# Patient Record
Sex: Male | Born: 1980 | Race: Black or African American | Hispanic: No | Smoking: Current every day smoker
Health system: Southern US, Community
[De-identification: ages and names within clinical notes are randomized; demographics above are authoritative.]

---

## 2004-08-28 ENCOUNTER — Emergency Department: Payer: Self-pay | Admitting: Emergency Medicine

## 2004-12-10 ENCOUNTER — Emergency Department: Payer: Self-pay | Admitting: Emergency Medicine

## 2005-06-19 ENCOUNTER — Emergency Department: Payer: Self-pay | Admitting: Emergency Medicine

## 2005-10-21 ENCOUNTER — Emergency Department: Payer: Self-pay | Admitting: Emergency Medicine

## 2006-06-17 ENCOUNTER — Emergency Department: Payer: Self-pay | Admitting: Emergency Medicine

## 2007-05-03 ENCOUNTER — Emergency Department: Payer: Self-pay | Admitting: Emergency Medicine

## 2007-05-11 ENCOUNTER — Emergency Department: Payer: Self-pay | Admitting: Emergency Medicine

## 2007-10-26 ENCOUNTER — Emergency Department (HOSPITAL_COMMUNITY): Admission: EM | Admit: 2007-10-26 | Discharge: 2007-10-27 | Payer: Self-pay | Admitting: Emergency Medicine

## 2009-11-14 ENCOUNTER — Emergency Department: Payer: Self-pay | Admitting: Internal Medicine

## 2010-10-15 ENCOUNTER — Emergency Department: Payer: Self-pay | Admitting: Emergency Medicine

## 2011-02-16 LAB — DIFFERENTIAL
Eosinophils Absolute: 0
Eosinophils Relative: 0
Lymphocytes Relative: 14
Lymphs Abs: 1.1
Monocytes Relative: 13 — ABNORMAL HIGH

## 2011-02-16 LAB — URINALYSIS, ROUTINE W REFLEX MICROSCOPIC
Glucose, UA: NEGATIVE
Hgb urine dipstick: NEGATIVE
Ketones, ur: NEGATIVE
pH: 5.5

## 2011-02-16 LAB — COMPREHENSIVE METABOLIC PANEL
ALT: 14
AST: 21
Albumin: 3.7
CO2: 20
Calcium: 8.3 — ABNORMAL LOW
Creatinine, Ser: 1.14
GFR calc Af Amer: >60
GFR calc non Af Amer: >60
Sodium: 133 — ABNORMAL LOW
Total Protein: 6.5

## 2011-02-16 LAB — URINE MICROSCOPIC-ADD ON

## 2011-02-16 LAB — CBC
MCHC: 34.8
MCV: 86.4
Platelets: 234
RBC: 5.25
RDW: 13.4

## 2011-02-16 LAB — INFLUENZA A+B VIRUS AG-DIRECT(RAPID): Inflenza A Ag: NEGATIVE

## 2011-02-16 LAB — URINE CULTURE

## 2011-03-31 ENCOUNTER — Emergency Department: Payer: Self-pay | Admitting: Emergency Medicine

## 2014-02-06 ENCOUNTER — Emergency Department: Payer: Self-pay | Admitting: Emergency Medicine

## 2015-06-30 ENCOUNTER — Emergency Department
Admission: EM | Admit: 2015-06-30 | Discharge: 2015-06-30 | Disposition: A | Discharge status: Home / Self Care | Payer: Medicaid Other | Attending: Emergency Medicine | Admitting: Emergency Medicine

## 2015-06-30 ENCOUNTER — Encounter: Payer: Self-pay | Admitting: Emergency Medicine

## 2015-06-30 DIAGNOSIS — R112 Nausea with vomiting, unspecified: Secondary | ICD-10-CM | POA: Insufficient documentation

## 2015-06-30 DIAGNOSIS — R1012 Left upper quadrant pain: Secondary | ICD-10-CM | POA: Diagnosis not present

## 2015-06-30 DIAGNOSIS — F17210 Nicotine dependence, cigarettes, uncomplicated: Secondary | ICD-10-CM | POA: Diagnosis not present

## 2015-06-30 DIAGNOSIS — R197 Diarrhea, unspecified: Secondary | ICD-10-CM | POA: Insufficient documentation

## 2015-06-30 DIAGNOSIS — M549 Dorsalgia, unspecified: Secondary | ICD-10-CM | POA: Insufficient documentation

## 2015-06-30 DIAGNOSIS — R1013 Epigastric pain: Secondary | ICD-10-CM | POA: Diagnosis not present

## 2015-06-30 LAB — CBC
HEMATOCRIT: 50.3 % (ref 40.0–52.0)
Hemoglobin: 16.8 g/dL (ref 13.0–18.0)
MCH: 27.8 pg (ref 26.0–34.0)
MCHC: 33.3 g/dL (ref 32.0–36.0)
MCV: 83.5 fL (ref 80.0–100.0)
Platelets: 279 10*3/uL (ref 150–440)
RBC: 6.02 MIL/uL — ABNORMAL HIGH (ref 4.40–5.90)
RDW: 14.1 % (ref 11.5–14.5)
WBC: 8.6 10*3/uL (ref 3.8–10.6)

## 2015-06-30 LAB — COMPREHENSIVE METABOLIC PANEL
ALBUMIN: 4.5 g/dL (ref 3.5–5.0)
ALT: 19 U/L (ref 17–63)
AST: 30 U/L (ref 15–41)
Alkaline Phosphatase: 108 U/L (ref 38–126)
Anion gap: 10 (ref 5–15)
BILIRUBIN TOTAL: 0.9 mg/dL (ref 0.3–1.2)
BUN: 13 mg/dL (ref 6–20)
CHLORIDE: 108 mmol/L (ref 101–111)
CO2: 21 mmol/L — ABNORMAL LOW (ref 22–32)
Calcium: 9.3 mg/dL (ref 8.9–10.3)
Creatinine, Ser: 1.1 mg/dL (ref 0.61–1.24)
GFR calc Af Amer: >60 mL/min (ref >60)
GFR calc non Af Amer: >60 mL/min (ref >60)
GLUCOSE: 123 mg/dL — AB (ref 65–99)
POTASSIUM: 3.9 mmol/L (ref 3.5–5.1)
Sodium: 139 mmol/L (ref 135–145)
TOTAL PROTEIN: 7.5 g/dL (ref 6.5–8.1)

## 2015-06-30 LAB — URINALYSIS COMPLETE WITH MICROSCOPIC (ARMC ONLY)
Bilirubin Urine: NEGATIVE
GLUCOSE, UA: NEGATIVE mg/dL
Hgb urine dipstick: NEGATIVE
Ketones, ur: NEGATIVE mg/dL
Leukocytes, UA: NEGATIVE
Nitrite: NEGATIVE
Protein, ur: NEGATIVE mg/dL
RBC / HPF: NONE SEEN RBC/hpf (ref 0–5)
Specific Gravity, Urine: 1.029 (ref 1.005–1.030)
pH: 5 (ref 5.0–8.0)

## 2015-06-30 LAB — LIPASE, BLOOD: Lipase: 24 U/L (ref 11–51)

## 2015-06-30 MED ORDER — RANITIDINE HCL 150 MG PO TABS: 150.0000 mg | ORAL_TABLET | Freq: Two times a day (BID) | ORAL | Status: DC

## 2015-06-30 MED ORDER — SODIUM CHLORIDE 0.9 % IV BOLUS (SEPSIS)
1000.0000 mL | Freq: Once | INTRAVENOUS | Status: AC
  Administered 2015-06-30: 1000 mL via INTRAVENOUS

## 2015-06-30 MED ORDER — ONDANSETRON HCL 4 MG PO TABS: 4.0000 mg | ORAL_TABLET | Freq: Every day | ORAL | Status: DC | PRN

## 2015-06-30 MED ORDER — ONDANSETRON HCL 4 MG/2ML IJ SOLN
4.0000 mg | Freq: Once | INTRAMUSCULAR | Status: AC | PRN
  Administered 2015-06-30: 4 mg via INTRAVENOUS

## 2015-06-30 MED ORDER — ONDANSETRON HCL 4 MG/2ML IJ SOLN
INTRAMUSCULAR | Status: AC
  Administered 2015-06-30: 4 mg via INTRAVENOUS
  Filled 2015-06-30: qty 2

## 2015-06-30 NOTE — ED Provider Notes (Signed)
[facility] Emergency Department Provider Note  ____________________________________________  Time seen: Approximately 545 PM  I have reviewed the triage vital signs and the nursing notes.   HISTORY  Chief Complaint Emesis and Diarrhea    HPI Austin Herrera is a 35 y.o. male without any chronic medical conditions who is presenting today for nausea vomiting diarrhea and epigastric pain. He says that he has had 5-6 episodes of vomiting and diarrhea. No blood in his diarrhea or vomitus. Multiple sick contacts at home. No runny nose or cough. Says that he has not vomited since arriving to the hospital and tolerated a pair as well as some sips of water by mouth.   History reviewed. No pertinent past medical history.  There are no active problems to display for this patient.   History reviewed. No pertinent past surgical history.  No current outpatient prescriptions on file.  Allergies Review of patient's allergies indicates no known allergies.  No family history on file.  Social History Social History  Substance Use Topics  . Smoking status: Current Every Day Smoker -- 0.25 packs/day    Types: Cigarettes  . Smokeless tobacco: None  . Alcohol Use: Yes     Comment: socially    Review of Systems Constitutional: No fever/chills Eyes: No visual changes. ENT: No sore throat. Cardiovascular: Denies chest pain. Respiratory: Denies shortness of breath. Gastrointestinal:No constipation. Genitourinary: Negative for dysuria. Musculoskeletal: Diffuse aching to his back Skin: Negative for rash. Neurological: Negative for headaches, focal weakness or numbness.  10-point ROS otherwise negative.  ____________________________________________   PHYSICAL EXAM:  VITAL SIGNS: ED Triage Vitals  Enc Vitals Group     BP 06/30/15 1618 139/78 mmHg     Pulse Rate 06/30/15 1618 103     Resp 06/30/15 1618 18     Temp 06/30/15 1618 98.1 F (36.7 C)     Temp  Source 06/30/15 1618 Oral     SpO2 06/30/15 1618 99 %     Weight 06/30/15 1618 200 lb (90.719 kg)     Height 06/30/15 1618 6' (1.829 m)     Head Cir --      Peak Flow --      Pain Score 06/30/15 1619 5     Pain Loc --      Pain Edu? --      Excl. in GC? --     Constitutional: Alert and oriented. Well appearing and in no acute distress. Eyes: Conjunctivae are normal. PERRL. EOMI. Head: Atraumatic. Nose: No congestion/rhinnorhea. Mouth/Throat: Mucous membranes are moist.   Neck: No stridor.   Cardiovascular: Normal rate, regular rhythm. Heart rate was 83 for me in the room. rossly normal heart sounds.  Good peripheral circulation. Respiratory: Normal respiratory effort.  No retractions. Lungs CTAB. Gastrointestinal: Soft with minimal left upper quadrant as well as epigastric tenderness palpation. No distention.  No CVA tenderness. Musculoskeletal: No lower extremity tenderness nor edema.  No joint effusions. Neurologic:  Normal speech and language. No gross focal neurologic deficits are appreciated. No gait instability. Skin:  Skin is warm, dry and intact. No rash noted. Psychiatric: Mood and affect are normal. Speech and behavior are normal.  ____________________________________________   LABS (all labs ordered are listed, but only abnormal results are displayed)  Labs Reviewed  COMPREHENSIVE METABOLIC PANEL - Abnormal; Notable for the following:    CO2 21 (*)    Glucose, Bld 123 (*)    All other components within normal limits  CBC - Abnormal;  Notable for the following:    RBC 6.02 (*)    All other components within normal limits  URINALYSIS COMPLETEWITH MICROSCOPIC ([facility] ONLY) - Abnormal; Notable for the following:    Color, Urine YELLOW (*)    APPearance CLEAR (*)    Bacteria, UA RARE (*)    Squamous Epithelial / LPF 0-5 (*)    All other components within normal limits  LIPASE, BLOOD    ____________________________________________  EKG   ____________________________________________  RADIOLOGY   ____________________________________________   PROCEDURES   ____________________________________________   INITIAL IMPRESSION / ASSESSMENT AND PLAN / ED COURSE  Pertinent labs & imaging results that were available during my care of the patient were reviewed by me and considered in my medical decision making (see chart for details).  Patient with multiple sick contacts as well as a very reassuring lab workup. No lower abdominal tenderness or right lower quadrant tenderness palpation. I did give him return precautions for pain radiating down to his lower abdomen to the right lower quadrant. He understands the plan and is willing to comply. Will be discharged with Zofran as well as an antacid. Will follow-up with his primary care doctor.  ____________________________________________   FINAL CLINICAL IMPRESSION(S) / ED DIAGNOSES  Abdominal pain with nausea vomiting and diarrhea.    Myrna Blazer, MD 06/30/15 [phone]

## 2015-06-30 NOTE — ED Notes (Signed)
AAOx3.  Skin warm and dry.  NAD 

## 2015-06-30 NOTE — ED Notes (Signed)
Patient presents to the ED with nausea and vomiting that started at 6am this morning.  Patient states he has only been able to eat an apple and a pear today.  Patient estimates vomiting about 5-6 times today and having about 3 episodes of diarrhea.  Patient is complaining of weakness.  Patient reports epigastric pain that is worse when he lies down.

## 2015-06-30 NOTE — Discharge Instructions (Signed)
Abdominal Pain, Adult Many things can cause belly (abdominal) pain. Most times, the belly pain is not dangerous. Many cases of belly pain can be watched and treated at home. HOME CARE   Do not take medicines that help you go poop (laxatives) unless told to by your doctor.  Only take medicine as told by your doctor.  Eat or drink as told by your doctor. Your doctor will tell you if you should be on a special diet. GET HELP IF:  You do not know what is causing your belly pain.  You have belly pain while you are sick to your stomach (nauseous) or have runny poop (diarrhea).  You have pain while you pee or poop.  Your belly pain wakes you up at night.  You have belly pain that gets worse or better when you eat.  You have belly pain that gets worse when you eat fatty foods.  You have a fever. GET HELP RIGHT AWAY IF:   The pain does not go away within 2 hours.  You keep throwing up (vomiting).  The pain changes and is only in the right or left part of the belly.  You have bloody or tarry looking poop. MAKE SURE YOU:   Understand these instructions.  Will watch your condition.  Will get help right away if you are not doing well or get worse.   This information is not intended to replace advice given to you by your health care provider. Make sure you discuss any questions you have with your health care provider.   Document Released: 10/25/2007 Document Revised: 05/29/2014 Document Reviewed: 01/15/2013 Elsevier Interactive Patient Education 2016 [place].  Diarrhea Diarrhea is frequent loose and watery bowel movements. It can cause you to feel weak and dehydrated. Dehydration can cause you to become tired and thirsty, have a dry mouth, and have decreased urination that often is dark yellow. Diarrhea is a sign of another problem, most often an infection that will not last long. In most cases, diarrhea typically lasts 2-3 days. However, it can last longer if it is a sign of  something more serious. It is important to treat your diarrhea as directed by your caregiver to lessen or prevent future episodes of diarrhea. CAUSES  Some common causes include:  Gastrointestinal infections caused by viruses, bacteria, or parasites.  Food poisoning or food allergies.  Certain medicines, such as antibiotics, chemotherapy, and laxatives.  Artificial sweeteners and fructose.  Digestive disorders. HOME CARE INSTRUCTIONS  Ensure adequate fluid intake (hydration): Have 1 cup (8 oz) of fluid for each diarrhea episode. Avoid fluids that contain simple sugars or sports drinks, fruit juices, whole milk products, and sodas. Your urine should be clear or pale yellow if you are drinking enough fluids. Hydrate with an oral rehydration solution that you can purchase at pharmacies, retail stores, and online. You can prepare an oral rehydration solution at home by mixing the following ingredients together:   - tsp table salt.   tsp baking soda.   tsp salt substitute containing potassium chloride.  1  tablespoons sugar.  1 L (34 oz) of water.  Certain foods and beverages may increase the speed at which food moves through the gastrointestinal (GI) tract. These foods and beverages should be avoided and include:  Caffeinated and alcoholic beverages.  High-fiber foods, such as raw fruits and vegetables, nuts, seeds, and whole grain breads and cereals.  Foods and beverages sweetened with sugar alcohols, such as xylitol, sorbitol, and mannitol.  Some  foods may be well tolerated and may help thicken stool including:  Starchy foods, such as rice, toast, pasta, low-sugar cereal, oatmeal, grits, baked potatoes, crackers, and bagels.  Bananas.  Applesauce.  Add probiotic-rich foods to help increase healthy bacteria in the GI tract, such as yogurt and fermented milk products.  Wash your hands well after each diarrhea episode.  Only take over-the-counter or prescription medicines  as directed by your caregiver.  Take a warm bath to relieve any burning or pain from frequent diarrhea episodes. SEEK IMMEDIATE MEDICAL CARE IF:   You are unable to keep fluids down.  You have persistent vomiting.  You have blood in your stool, or your stools are black and tarry.  You do not urinate in 6-8 hours, or there is only a small amount of very dark urine.  You have abdominal pain that increases or localizes.  You have weakness, dizziness, confusion, or light-headedness.  You have a severe headache.  Your diarrhea gets worse or does not get better.  You have a fever or persistent symptoms for more than 2-3 days.  You have a fever and your symptoms suddenly get worse. MAKE SURE YOU:   Understand these instructions.  Will watch your condition.  Will get help right away if you are not doing well or get worse.   This information is not intended to replace advice given to you by your health care provider. Make sure you discuss any questions you have with your health care provider.   Document Released: 04/28/2002 Document Revised: 05/29/2014 Document Reviewed: 01/14/2012 Elsevier Interactive Patient Education 2016 Elsevier Inc[employer]  Nausea and Vomiting Nausea is a sick feeling that often comes before throwing up (vomiting). Vomiting is a reflex where stomach contents come out of your mouth. Vomiting can cause severe loss of body fluids (dehydration). Children and elderly adults can become dehydrated quickly, especially if they also have diarrhea. Nausea and vomiting are symptoms of a condition or disease. It is important to find the cause of your symptoms. CAUSES   Direct irritation of the stomach lining. This irritation can result from increased acid production (gastroesophageal reflux disease), infection, food poisoning, taking certain medicines (such as nonsteroidal anti-inflammatory drugs), alcohol use, or tobacco use.  Signals from the brain.These signals could be  caused by a headache, heat exposure, an inner ear disturbance, increased pressure in the brain from injury, infection, a tumor, or a concussion, pain, emotional stimulus, or metabolic problems.  An obstruction in the gastrointestinal tract (bowel obstruction).  Illnesses such as diabetes, hepatitis, gallbladder problems, appendicitis, kidney problems, cancer, sepsis, atypical symptoms of a heart attack, or eating disorders.  Medical treatments such as chemotherapy and radiation.  Receiving medicine that makes you sleep (general anesthetic) during surgery. DIAGNOSIS Your caregiver may ask for tests to be done if the problems do not improve after a few days. Tests may also be done if symptoms are severe or if the reason for the nausea and vomiting is not clear. Tests may include:  Urine tests.  Blood tests.  Stool tests.  Cultures (to look for evidence of infection).  X-rays or other imaging studies. Test results can help your caregiver make decisions about treatment or the need for additional tests. TREATMENT You need to stay well hydrated. Drink frequently but in small amounts.You may wish to drink water, sports drinks, clear broth, or eat frozen ice pops or gelatin dessert to help stay hydrated.When you eat, eating slowly may help prevent nausea.There are also some antinausea medicines  that may help prevent nausea. °HOME CARE INSTRUCTIONS  °· Take all medicine as directed by your caregiver. °· If you do not have an appetite, do not force yourself to eat. However, you must continue to drink fluids. °· If you have an appetite, eat a normal diet unless your caregiver tells you differently. °¨ Eat a variety of complex carbohydrates (rice, wheat, potatoes, bread), lean meats, yogurt, fruits, and vegetables. °¨ Avoid high-fat foods because they are more difficult to digest. °· Drink enough water and fluids to keep your urine clear or pale yellow. °· If you are dehydrated, ask your caregiver for  specific rehydration instructions. Signs of dehydration may include: °¨ Severe thirst. °¨ Dry lips and mouth. °¨ Dizziness. °¨ Dark urine. °¨ Decreasing urine frequency and amount. °¨ Confusion. °¨ Rapid breathing or pulse. °SEEK IMMEDIATE MEDICAL CARE IF:  °· You have blood or brown flecks (like coffee grounds) in your vomit. °· You have black or bloody stools. °· You have a severe headache or stiff neck. °· You are confused. °· You have severe abdominal pain. °· You have chest pain or trouble breathing. °· You do not urinate at least once every 8 hours. °· You develop cold or clammy skin. °· You continue to vomit for longer than 24 to 48 hours. °· You have a fever. °MAKE SURE YOU:  °· Understand these instructions. °· Will watch your condition. °· Will get help right away if you are not doing well or get worse. °  °This information is not intended to replace advice given to you by your health care provider. Make sure you discuss any questions you have with your health care provider. °  °Document Released: 05/08/2005 Document Revised: 07/31/2011 Document Reviewed: 10/05/2010 °Elsevier Interactive Patient Education ©2016 Elsevier Inc. ° °

## 2017-09-21 ENCOUNTER — Other Ambulatory Visit: Payer: Self-pay

## 2017-09-21 ENCOUNTER — Encounter: Payer: Self-pay | Admitting: Emergency Medicine

## 2017-09-21 ENCOUNTER — Emergency Department: Payer: Self-pay

## 2017-09-21 ENCOUNTER — Emergency Department
Admission: EM | Admit: 2017-09-21 | Discharge: 2017-09-21 | Disposition: A | Discharge status: Home / Self Care | Payer: Self-pay | Attending: Emergency Medicine | Admitting: Emergency Medicine

## 2017-09-21 DIAGNOSIS — M940 Chondrocostal junction syndrome [Tietze]: Secondary | ICD-10-CM | POA: Insufficient documentation

## 2017-09-21 DIAGNOSIS — R05 Cough: Secondary | ICD-10-CM | POA: Insufficient documentation

## 2017-09-21 DIAGNOSIS — F17210 Nicotine dependence, cigarettes, uncomplicated: Secondary | ICD-10-CM | POA: Insufficient documentation

## 2017-09-21 DIAGNOSIS — Z79899 Other long term (current) drug therapy: Secondary | ICD-10-CM | POA: Insufficient documentation

## 2017-09-21 LAB — URINALYSIS, COMPLETE (UACMP) WITH MICROSCOPIC
Bacteria, UA: NONE SEEN
Glucose, UA: NEGATIVE mg/dL
Hgb urine dipstick: NEGATIVE
Ketones, ur: 5 mg/dL — AB
Nitrite: NEGATIVE
PH: 5 (ref 5.0–8.0)
PROTEIN: 100 mg/dL — AB
SQUAMOUS EPITHELIAL / LPF: NONE SEEN (ref 0–5)
Specific Gravity, Urine: 1.038 — ABNORMAL HIGH (ref 1.005–1.030)

## 2017-09-21 MED ORDER — NAPROXEN 500 MG PO TABS: 500.0000 mg | ORAL_TABLET | Freq: Two times a day (BID) | ORAL | Status: DC

## 2017-09-21 MED ORDER — BENZONATATE 100 MG PO CAPS: 200.0000 mg | ORAL_CAPSULE | Freq: Three times a day (TID) | ORAL | 0 refills | Status: AC | PRN

## 2017-09-21 MED ORDER — CYCLOBENZAPRINE HCL 10 MG PO TABS: 10.0000 mg | ORAL_TABLET | Freq: Three times a day (TID) | ORAL | 0 refills | Status: DC | PRN

## 2017-09-21 NOTE — Discharge Instructions (Addendum)
Apply moist heat to the area for 5 to 10 minutes twice a day.  Take medication as directed.

## 2017-09-21 NOTE — ED Triage Notes (Signed)
presents with pain to left lateral rib area for about 1 week or so  Then developed a sharp pain which radiated into back s/p cough

## 2017-09-21 NOTE — ED Notes (Signed)
First Nurse Note:  Patient complaining of left lower back pain, started after coughing yesterday.

## 2017-09-21 NOTE — ED Notes (Signed)
Pt raised to stretcher to high level    Provider aware

## 2017-09-21 NOTE — ED Provider Notes (Signed)
[facility] Emergency Department Provider Note   ____________________________________________   First MD Initiated Contact with Patient 09/21/17 1103     (approximate)  I have reviewed the triage vital signs and the nursing notes.   HISTORY  Chief Complaint Back Pain    HPI Austin Herrera is a 37 y.o. male patient complain of left anterior and lateral rib pain that radiates to his back with deep cough.  Patient states complaint is increasing over the past week.  Patient state mild relief with anti-inflammatories and a numbing cream given by his mother.  Patient rates pain as a 6/10.  Patient described the pain is "aching".  History reviewed. No pertinent past medical history.  There are no active problems to display for this patient.   History reviewed. No pertinent surgical history.  Prior to Admission medications   Medication Sig Start Date End Date Taking? Authorizing Provider  benzonatate (TESSALON PERLES) 100 MG capsule Take 2 capsules (200 mg total) by mouth 3 (three) times daily as needed. 09/21/17 09/21/18  Joni Reining, PA-C  cyclobenzaprine (FLEXERIL) 10 MG tablet Take 1 tablet (10 mg total) by mouth 3 (three) times daily as needed. 09/21/17   Joni Reining, PA-C  naproxen (NAPROSYN) 500 MG tablet Take 1 tablet (500 mg total) by mouth 2 (two) times daily with a meal. 09/21/17   Joni Reining, PA-C  ondansetron (ZOFRAN) 4 MG tablet Take 1 tablet (4 mg total) by mouth daily as needed. 06/30/15   Myrna Blazer, MD  ranitidine (ZANTAC) 150 MG tablet Take 1 tablet (150 mg total) by mouth 2 (two) times daily. 06/30/15 06/29/16  Schaevitz, Myra Rude, MD    Allergies Patient has no known allergies.  No family history on file.  Social History Social History   Tobacco Use  . Smoking status: Current Every Day Smoker    Packs/day: 0.25    Types: Cigarettes  . Smokeless tobacco: Never Used  Substance Use Topics  . Alcohol use: Yes   Comment: socially  . Drug use: Not on file    Review of Systems Constitutional: No fever/chills Eyes: No visual changes. ENT: No sore throat. Cardiovascular: Denies chest pain. Respiratory: Denies shortness of breath. Gastrointestinal: No abdominal pain.  No nausea, no vomiting.  No diarrhea.  No constipation. Genitourinary: Negative for dysuria. Musculoskeletal: Left lateral chest wall and left flank pain.   Skin: Negative for rash. Neurological: Negative for headaches, focal weakness or numbness.   ____________________________________________   PHYSICAL EXAM:  VITAL SIGNS: ED Triage Vitals [09/21/17 1103]  Enc Vitals Group     BP      Pulse      Resp      Temp      Temp src      SpO2      Weight 214 lb (97.1 kg)     Height 6' (1.829 m)     Head Circumference      Peak Flow      Pain Score 6     Pain Loc      Pain Edu?      Excl. in GC?    Constitutional: Alert and oriented. Well appearing and in no acute distress. Cardiovascular: Normal rate, regular rhythm. Grossly normal heart sounds.  Good peripheral circulation. Respiratory: Normal respiratory effort.  No retractions. Lungs CTAB. Gastrointestinal: Soft and nontender. No distention. No abdominal bruits.  Left CVA tenderness. Musculoskeletal: No lower extremity tenderness nor edema.  No joint  effusions. Neurologic:  Normal speech and language. No gross focal neurologic deficits are appreciated. No gait instability. Skin:  Skin is warm, dry and intact. No rash noted. Psychiatric: Mood and affect are normal. Speech and behavior are normal.  ____________________________________________   LABS (all labs ordered are listed, but only abnormal results are displayed)  Labs Reviewed  URINALYSIS, COMPLETE (UACMP) WITH MICROSCOPIC - Abnormal; Notable for the following components:      Result Value   Color, Urine AMBER (*)    APPearance CLEAR (*)    Specific Gravity, Urine 1.038 (*)    Bilirubin Urine SMALL (*)     Ketones, ur 5 (*)    Protein, ur 100 (*)    Leukocytes, UA TRACE (*)    All other components within normal limits   ____________________________________________  EKG   ____________________________________________  RADIOLOGY  No acute findings on chest x-ray.  Official radiology report(s): Dg Chest 2 View  Result Date: 09/21/2017 CLINICAL DATA:  Left-sided chest pain extending from the breast into the back for 1 week. Smoker's cough. EXAM: CHEST - 2 VIEW COMPARISON:  None. FINDINGS: The heart size and mediastinal contours are normal. The lungs are clear. There is no pleural effusion or pneumothorax. No acute osseous findings are identified. IMPRESSION: No active cardiopulmonary process. Electronically Signed   By: Carey Bullocks M.D.   On: 09/21/2017 11:34    ____________________________________________   PROCEDURES  Procedure(s) performed: None  Procedures  Critical Care performed: No  ____________________________________________   INITIAL IMPRESSION / ASSESSMENT AND PLAN / ED COURSE  As part of my medical decision making, I reviewed the following data within the electronic MEDICAL RECORD NUMBER   Patient presented with chest wall pain secondary to prolonged coughing.  Discussed x-ray findings and lab results with patient which are unremarkable.  Patient given discharge care instruction advised take medication as directed.  Patient advised to [facility] if condition persists.     ____________________________________________   FINAL CLINICAL IMPRESSION(S) / ED DIAGNOSES  Final diagnoses:  Costochondritis     ED Discharge Orders        Ordered    naproxen (NAPROSYN) 500 MG tablet  2 times daily with meals     09/21/17 1148    cyclobenzaprine (FLEXERIL) 10 MG tablet  3 times daily PRN     09/21/17 1148    benzonatate (TESSALON PERLES) 100 MG capsule  3 times daily PRN     09/21/17 1200       Note:  This document was prepared using Dragon  voice recognition software and may include unintentional dictation errors.    Joni Reining, PA-C 09/21/17 1201    Arnaldo Natal, MD 09/28/17 [phone]

## 2018-11-08 ENCOUNTER — Emergency Department: Payer: Self-pay

## 2018-11-08 ENCOUNTER — Other Ambulatory Visit: Payer: Self-pay

## 2018-11-08 ENCOUNTER — Encounter: Payer: Self-pay | Admitting: Emergency Medicine

## 2018-11-08 ENCOUNTER — Emergency Department
Admission: EM | Admit: 2018-11-08 | Discharge: 2018-11-08 | Disposition: A | Discharge status: Home / Self Care | Payer: Self-pay | Attending: Emergency Medicine | Admitting: Emergency Medicine

## 2018-11-08 DIAGNOSIS — X509XXA Other and unspecified overexertion or strenuous movements or postures, initial encounter: Secondary | ICD-10-CM | POA: Insufficient documentation

## 2018-11-08 DIAGNOSIS — Y9389 Activity, other specified: Secondary | ICD-10-CM | POA: Insufficient documentation

## 2018-11-08 DIAGNOSIS — F17210 Nicotine dependence, cigarettes, uncomplicated: Secondary | ICD-10-CM | POA: Insufficient documentation

## 2018-11-08 DIAGNOSIS — Y998 Other external cause status: Secondary | ICD-10-CM | POA: Insufficient documentation

## 2018-11-08 DIAGNOSIS — S62355A Nondisplaced fracture of shaft of fourth metacarpal bone, left hand, initial encounter for closed fracture: Secondary | ICD-10-CM | POA: Insufficient documentation

## 2018-11-08 DIAGNOSIS — Y92019 Unspecified place in single-family (private) house as the place of occurrence of the external cause: Secondary | ICD-10-CM | POA: Insufficient documentation

## 2018-11-08 MED ORDER — HYDROCODONE-ACETAMINOPHEN 5-325 MG PO TABS: 1.0000 | ORAL_TABLET | Freq: Four times a day (QID) | ORAL | 0 refills | Status: DC | PRN

## 2018-11-08 MED ORDER — IBUPROFEN 600 MG PO TABS
600.0000 mg | ORAL_TABLET | Freq: Once | ORAL | Status: AC
  Administered 2018-11-08: 600 mg via ORAL
  Filled 2018-11-08: qty 1

## 2018-11-08 MED ORDER — HYDROCODONE-ACETAMINOPHEN 5-325 MG PO TABS
1.0000 | ORAL_TABLET | Freq: Once | ORAL | Status: AC
  Administered 2018-11-08: 12:00:00 1 via ORAL
  Filled 2018-11-08: qty 1

## 2018-11-08 NOTE — ED Triage Notes (Signed)
Pt reports he was attempting to restrain his 38yo autistic son, his left hand got wrapped in his son's shirt. He was trying to lift him from the ground heard "a pop", and reports pain to left wrist. Sensation intact.

## 2018-11-08 NOTE — ED Provider Notes (Signed)
[facility] Emergency Department Provider Note  ____________________________________________   First MD Initiated Contact with Patient 11/08/18 1119     (approximate)  I have reviewed the triage vital signs and the nursing notes.   HISTORY  Chief Complaint Wrist Pain   HPI Austin Herrera is a 38 y.o. male presents to the ED with complaint of left hand and wrist pain.  Patient states that he was attempting to restrain his 60 year old autistic son last evening and his hand and wrist got wrapped in the son's shirt.  Patient states that he heard a loud pop and has continued to have pain this morning.  He rates his pain as 9/10.      There are no active problems to display for this patient.   No past surgical history on file.  Prior to Admission medications   Medication Sig Start Date End Date Taking? Authorizing Provider  HYDROcodone-acetaminophen (NORCO/VICODIN) 5-325 MG tablet Take 1 tablet by mouth every 6 (six) hours as needed for moderate pain. 11/08/18   Johnn Hai, PA-C  ranitidine (ZANTAC) 150 MG tablet Take 1 tablet (150 mg total) by mouth 2 (two) times daily. 06/30/15 06/29/16  Schaevitz, Randall An, MD    Allergies Patient has no known allergies.  No family history on file.  Social History Social History   Tobacco Use  . Smoking status: Current Every Day Smoker    Packs/day: 0.25    Types: Cigarettes  . Smokeless tobacco: Never Used  Substance Use Topics  . Alcohol use: Yes    Comment: socially  . Drug use: Not on file    Review of Systems Constitutional: No fever/chills Cardiovascular: Denies chest pain. Respiratory: Denies shortness of breath. Musculoskeletal: Positive left hand and wrist pain. Skin: Negative for rash. Neurological: Negative for headaches, focal weakness or numbness. ____________________________________________   PHYSICAL EXAM:  VITAL SIGNS: ED Triage Vitals  Enc Vitals Group     BP 11/08/18 1107  128/80     Pulse Rate 11/08/18 1107 71     Resp 11/08/18 1107 16     Temp 11/08/18 1107 98.2 F (36.8 C)     Temp Source 11/08/18 1107 Oral     SpO2 11/08/18 1107 98 %     Weight 11/08/18 1107 215 lb (97.5 kg)     Height 11/08/18 1107 6' (1.829 m)     Head Circumference --      Peak Flow --      Pain Score 11/08/18 1119 9     Pain Loc --      Pain Edu? --      Excl. in [place]? --    Constitutional: Alert and oriented. Well appearing and in no acute distress. Eyes: Conjunctivae are normal.  Head: Atraumatic. Neck: No stridor.   Cardiovascular: Normal rate, regular rhythm. Grossly normal heart sounds.  Good peripheral circulation. Respiratory: Normal respiratory effort.  No retractions. Lungs CTAB. Musculoskeletal: On examination of the left wrist there is no gross deformity and minimal soft tissue edema or tenderness.  Patient is able to flex and extend without any difficulty.  Pulses present.  Patient is able to move all digits without any difficulty and skin is intact.  Patient is tender along the fourth and fifth metacarpal.  There is minimal soft tissue edema noted in this area.  Motor sensory function intact. Neurologic:  Normal speech and language. No gross focal neurologic deficits are appreciated. No gait instability. Skin:  Skin is warm, dry  and intact. No rash noted. Psychiatric: Mood and affect are normal. Speech and behavior are normal.  ____________________________________________   LABS (all labs ordered are listed, but only abnormal results are displayed)  Labs Reviewed - No data to display RADIOLOGY  ED MD interpretation:   Left wrist negative, left hand shows a nondisplaced fracture of the fourth metacarpal shaft.  Official radiology report(s): Dg Wrist Complete Left  Result Date: 11/08/2018 CLINICAL DATA:  LEFT wrist pain since last night, injury last night. EXAM: LEFT WRIST - COMPLETE 3+ VIEW COMPARISON:  None. FINDINGS: There is no evidence of fracture or  dislocation. There is no evidence of arthropathy or other focal bone abnormality. Soft tissues are unremarkable. IMPRESSION: Negative. Electronically Signed   By: Bary RichardStan  Maynard M.D.   On: 11/08/2018 11:48   Dg Hand Complete Left  Result Date: 11/08/2018 CLINICAL DATA:  Pain at the base of the 4th metacarpal carpal after lifting a heavy object yesterday. EXAM: LEFT HAND - COMPLETE 3+ VIEW COMPARISON:  Left wrist radiographs obtained today FINDINGS: Oblique fracture of the proximal 4th metacarpal shaft without significant displacement or angulation. No other fractures are seen. IMPRESSION: Nondisplaced 4th metacarpal shaft fracture. Electronically Signed   By: Beckie SaltsSteven  Reid M.D.   On: 11/08/2018 12:43    ____________________________________________   PROCEDURES  Procedure(s) performed (including Critical Care):  Procedures  OCL splint was applied by tech. ____________________________________________   INITIAL IMPRESSION / ASSESSMENT AND PLAN / ED COURSE  As part of my medical decision making, I reviewed the following data within the electronic MEDICAL RECORD NUMBER Notes from prior ED visits and [place] Controlled Substance Database  38 year old male presents to the ED with complaint of left wrist and hand pain after trying to restrain his 38 year old autistic son last evening.  Patient has continued to have pain.  Physical exam was suspicious for a fracture of the fourth or fifth metacarpal.  X-rays were obtained and patient does have a nondisplaced fracture of the fourth metacarpal.  He was made aware.  OCL splint was placed and patient is to make an appointment with Dr. Odis LusterBowers who is the orthopedist on call.  Ice and elevation and Norco as needed for pain.  ____________________________________________   FINAL CLINICAL IMPRESSION(S) / ED DIAGNOSES  Final diagnoses:  Closed nondisplaced fracture of shaft of fourth metacarpal bone of left hand, initial encounter     ED Discharge Orders          Ordered    HYDROcodone-acetaminophen (NORCO/VICODIN) 5-325 MG tablet  Every 6 hours PRN     11/08/18 1235           Note:  This document was prepared using Dragon voice recognition software and may include unintentional dictation errors.    Tommi RumpsSummers, Rhonda L, PA-C 11/08/18 1341    Minna AntisPaduchowski, Kevin, MD 11/08/18 1440

## 2018-11-08 NOTE — ED Notes (Signed)
See note by this RN.

## 2018-11-08 NOTE — ED Notes (Signed)
Pt given ice for hand.  

## 2018-11-08 NOTE — Discharge Instructions (Signed)
Make an appointment with Dr. Harlow Mares who is on-call for orthopedics.  Ice and elevation to reduce pain and swelling.  Take Norco as needed for pain.  Do not drive or operate machinery while taking this medication as it could cause drowsiness.  You may also take ibuprofen with this medication which will help with inflammation.  Leave the splint on until you are seen by the orthopedist.

## 2020-10-09 IMAGING — DX LEFT WRIST - COMPLETE 3+ VIEW
4 series · 4 of 4 positions shown · non-contrast
Comparison: None.

CLINICAL DATA: LEFT wrist pain since last night, injury last night.

EXAM:
LEFT WRIST - COMPLETE 3+ VIEW

[wrist ap (1 of 2)]
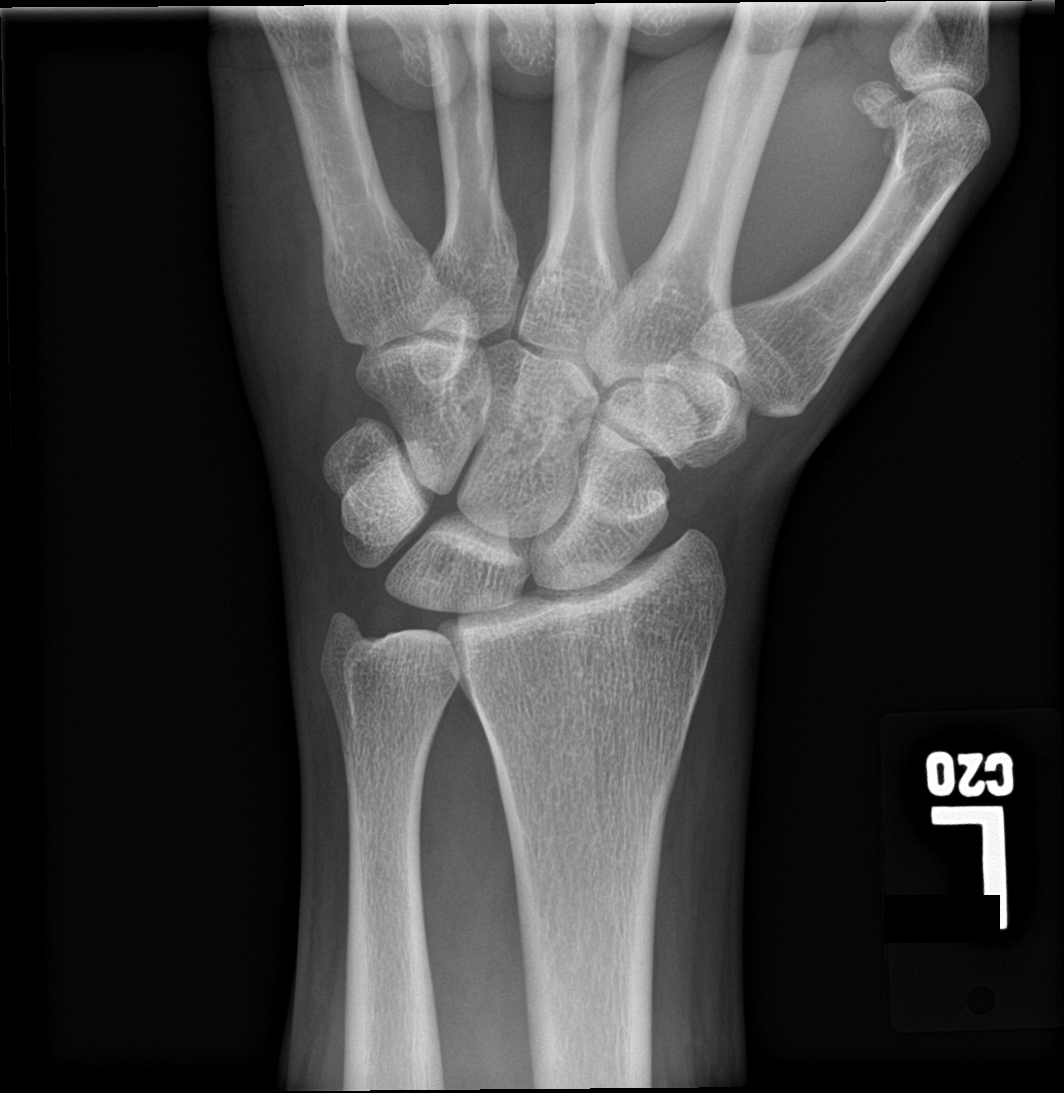

[wrist obl]
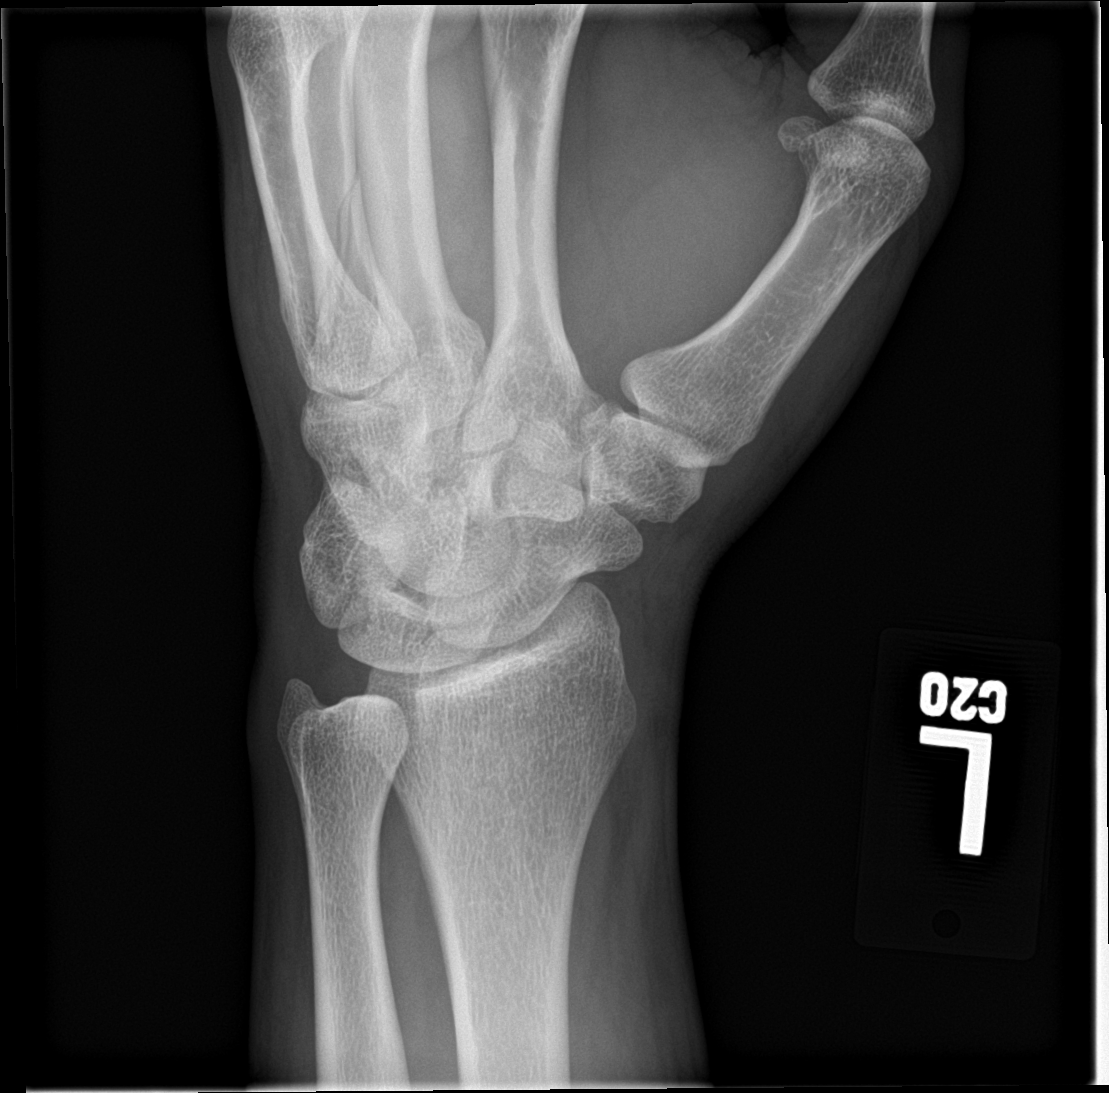

[wrist lat]
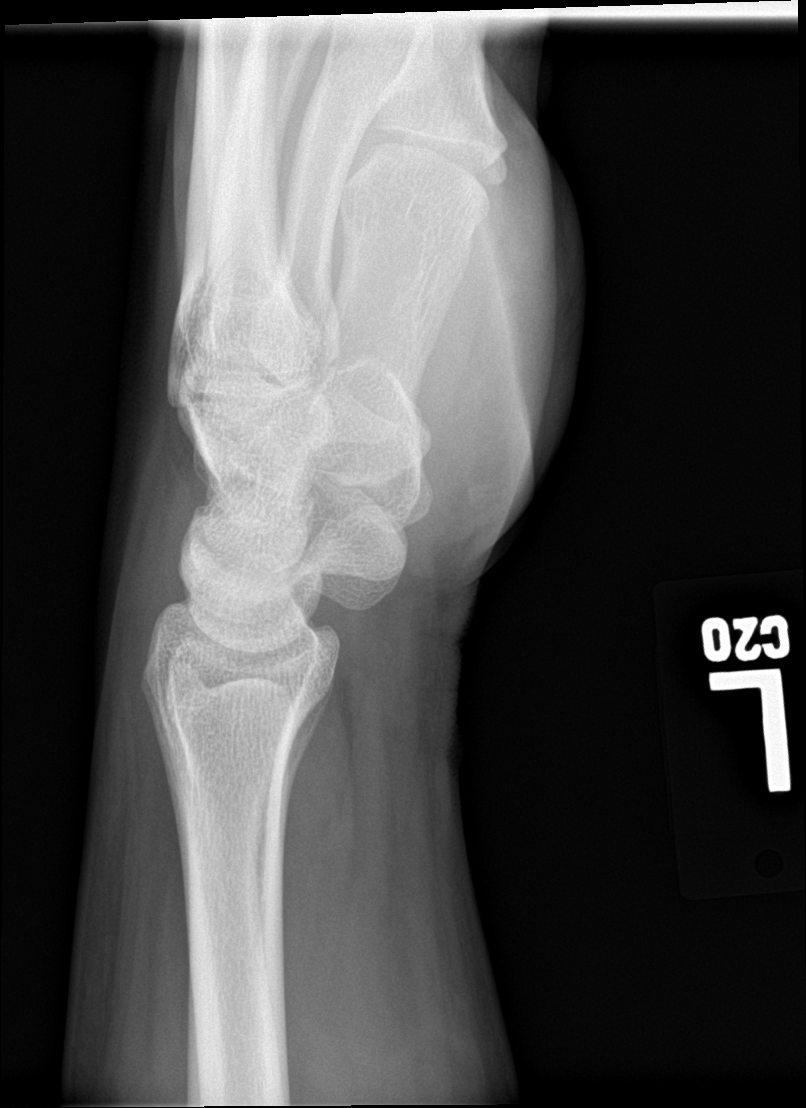

[wrist ap (2 of 2)]
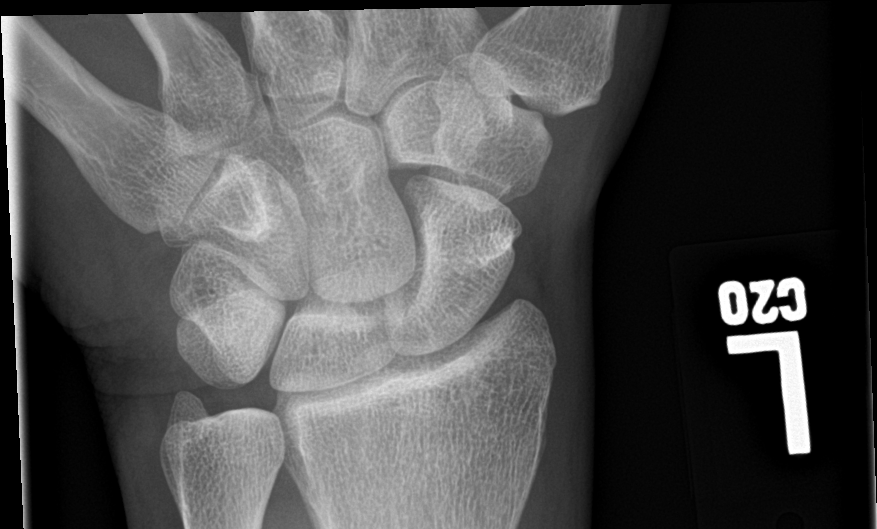

[4 of 4 positions shown; findings below may reference images not displayed]

FINDINGS: There is no evidence of fracture or dislocation. There is no
evidence of arthropathy or other focal bone abnormality. Soft
tissues are unremarkable.
IMPRESSION: Negative.

## 2020-11-14 ENCOUNTER — Emergency Department
Admission: EM | Admit: 2020-11-14 | Discharge: 2020-11-14 | Disposition: A | Discharge status: Home / Self Care | Payer: Self-pay | Attending: Emergency Medicine | Admitting: Emergency Medicine

## 2020-11-14 ENCOUNTER — Other Ambulatory Visit: Payer: Self-pay

## 2020-11-14 ENCOUNTER — Encounter: Payer: Self-pay | Admitting: Emergency Medicine

## 2020-11-14 DIAGNOSIS — F17210 Nicotine dependence, cigarettes, uncomplicated: Secondary | ICD-10-CM | POA: Insufficient documentation

## 2020-11-14 DIAGNOSIS — R369 Urethral discharge, unspecified: Secondary | ICD-10-CM | POA: Insufficient documentation

## 2020-11-14 DIAGNOSIS — Z113 Encounter for screening for infections with a predominantly sexual mode of transmission: Secondary | ICD-10-CM | POA: Insufficient documentation

## 2020-11-14 LAB — URINALYSIS, COMPLETE (UACMP) WITH MICROSCOPIC
Bacteria, UA: NONE SEEN
Bilirubin Urine: NEGATIVE
Glucose, UA: NEGATIVE mg/dL
Hgb urine dipstick: NEGATIVE
Ketones, ur: NEGATIVE mg/dL
Nitrite: NEGATIVE
Protein, ur: NEGATIVE mg/dL
Specific Gravity, Urine: 1.028 (ref 1.005–1.030)
WBC, UA: >50 WBC/hpf — ABNORMAL HIGH (ref 0–5)
pH: 5 (ref 5.0–8.0)

## 2020-11-14 LAB — CHLAMYDIA/NGC RT PCR (ARMC ONLY)
Chlamydia Tr: NOT DETECTED
N gonorrhoeae: DETECTED — AB

## 2020-11-14 MED ORDER — AZITHROMYCIN 500 MG PO TABS
1000.0000 mg | ORAL_TABLET | Freq: Once | ORAL | Status: AC
  Administered 2020-11-14: 1000 mg via ORAL
  Filled 2020-11-14: qty 2

## 2020-11-14 MED ORDER — CEFTRIAXONE SODIUM 1 G IJ SOLR
500.0000 mg | Freq: Once | INTRAMUSCULAR | Status: AC
  Administered 2020-11-14: 500 mg via INTRAMUSCULAR
  Filled 2020-11-14: qty 10

## 2020-11-14 NOTE — ED Provider Notes (Signed)
[facility] Emergency Department Provider Note  ____________________________________________   Event Date/Time   First MD Initiated Contact with Patient 11/14/20 [phone]     (approximate)  I have reviewed the triage vital signs and the nursing notes.   HISTORY  Chief Complaint SEXUALLY TRANSMITTED DISEASE   HPI Austin Herrera is a 40 y.o. male presents to the ED with complaint of penile discharge that began this morning.  Patient admits to having unprotected sex within the last 48 hours.  He is unaware of whether or not his partner is having any symptoms.  He states that it has been many years since he had either gonorrhea or chlamydia.  He denies any other symptoms.  He rates pain as 4 out of 10.       History reviewed. No pertinent past medical history.  There are no problems to display for this patient.   History reviewed. No pertinent surgical history.  Prior to Admission medications   Not on File    Allergies Patient has no known allergies.  No family history on file.  Social History Social History   Tobacco Use   Smoking status: Every Day    Packs/day: 0.25    Pack years: 0.00    Types: Cigarettes   Smokeless tobacco: Never  Substance Use Topics   Alcohol use: Yes    Comment: socially    Review of Systems Constitutional: No fever/chills Eyes: No visual changes. Cardiovascular: Denies chest pain. Respiratory: Denies shortness of breath. Gastrointestinal: No abdominal pain.  No nausea, no vomiting.  Genitourinary: Positive dysuria, positive penile discharge. Musculoskeletal: Negative for musculoskeletal pain. Skin: Negative for rash. Neurological: Negative for headaches, focal weakness or numbness. {____________________________________________   PHYSICAL EXAM:  VITAL SIGNS: ED Triage Vitals  Enc Vitals Group     BP 11/14/20 0902 133/74     Pulse Rate 11/14/20 0902 69     Resp 11/14/20 0902 16     Temp 11/14/20 0902 97.7  F (36.5 C)     Temp Source 11/14/20 0902 Oral     SpO2 11/14/20 0902 100 %     Weight 11/14/20 0901 214 lb 15.2 oz (97.5 kg)     Height 11/14/20 0858 6' (1.829 m)     Head Circumference --      Peak Flow --      Pain Score 11/14/20 0858 4     Pain Loc --      Pain Edu? --      Excl. in [place]? --     Constitutional: Alert and oriented. Well appearing and in no acute distress. Eyes: Conjunctivae are normal.  Head: Atraumatic. Neck: No stridor.   Cardiovascular: Normal rate, regular rhythm. Grossly normal heart sounds.  Good peripheral circulation. Respiratory: Normal respiratory effort.  No retractions. Lungs CTAB. Gastrointestinal: Soft and nontender. No distention.  Musculoskeletal: Ambulatory without any assistance and moves upper and lower extremities without any difficulty. Neurologic:  Normal speech and language. No gross focal neurologic deficits are appreciated. No gait instability. Skin:  Skin is warm, dry and intact. No rash noted. Psychiatric: Mood and affect are normal. Speech and behavior are normal.  ____________________________________________   LABS (all labs ordered are listed, but only abnormal results are displayed)  Labs Reviewed  CHLAMYDIA/NGC RT PCR ([facility] ONLY)           - Abnormal; Notable for the following components:      Result Value   N gonorrhoeae DETECTED (*)  All other components within normal limits  URINALYSIS, COMPLETE (UACMP) WITH MICROSCOPIC - Abnormal; Notable for the following components:   Color, Urine YELLOW (*)    APPearance CLOUDY (*)    Leukocytes,Ua LARGE (*)    WBC, UA >50 (*)    All other components within normal limits   ____________________________________________  PROCEDURES  Procedure(s) performed (including Critical Care):  Procedures   ____________________________________________   INITIAL IMPRESSION / ASSESSMENT AND PLAN / ED COURSE  As part of my medical decision making, I reviewed the following data within  the electronic MEDICAL RECORD NUMBER Notes from prior ED visits and [place] Controlled Substance Database  40 year old male presents to the ED with complaint of penile discharge that was noted this morning.  Patient reports that he did have unprotected sex and is unaware of whether his sexual partner is having any difficulties.  Patient denies any other symptoms.  Urinalysis showed greater than 50,000 WBCs microscopically.  Patient was treated for both gonorrhea and chlamydia and just prior to discharge test results were positive for gonorrhea.  Patient was made aware.  He also was given information about having his partner tested and treated at the [facility] department.  Patient was treated with Rocephin and Zithromax.  He is to follow-up also with STD clinic at [facility] department if any continued problems.  ____________________________________________   FINAL CLINICAL IMPRESSION(S) / ED DIAGNOSES  Final diagnoses:  Penile discharge  Screening for STD (sexually transmitted disease)     ED Discharge Orders     None        Note:  This document was prepared using Dragon voice recognition software and may include unintentional dictation errors.    Tommi Rumps, PA-C 11/14/20 1059    Sharyn Creamer, MD 11/15/20 [phone]

## 2020-11-14 NOTE — ED Notes (Signed)
See triage note  Presents with penile discharge   States he woke up with discharge this am  Denies any urinary burning  Thinks he was exposed to STD

## 2020-11-14 NOTE — ED Triage Notes (Signed)
Pt reports thinks he has contracted a STD. Pt reports penile discharge that started this am.

## 2020-11-14 NOTE — Discharge Instructions (Addendum)
Follow-up with [facility] department if any continued problems.  Your gonorrhea and Chlamydia test results can be seen on MyChart.  You should also contact your partner to let them know that they will need to call and make an appointment with the [facility] department for testing and possible treatment.

## 2021-01-01 ENCOUNTER — Emergency Department
Admission: EM | Admit: 2021-01-01 | Discharge: 2021-01-01 | Disposition: A | Discharge status: Home / Self Care | Payer: Self-pay | Attending: Emergency Medicine | Admitting: Emergency Medicine

## 2021-01-01 ENCOUNTER — Other Ambulatory Visit: Payer: Self-pay

## 2021-01-01 DIAGNOSIS — F17210 Nicotine dependence, cigarettes, uncomplicated: Secondary | ICD-10-CM | POA: Insufficient documentation

## 2021-01-01 DIAGNOSIS — A549 Gonococcal infection, unspecified: Secondary | ICD-10-CM | POA: Insufficient documentation

## 2021-01-01 LAB — URINALYSIS, COMPLETE (UACMP) WITH MICROSCOPIC
Bacteria, UA: NONE SEEN
Bilirubin Urine: NEGATIVE
Glucose, UA: NEGATIVE mg/dL
Hgb urine dipstick: NEGATIVE
Ketones, ur: 5 mg/dL — AB
Nitrite: NEGATIVE
Protein, ur: NEGATIVE mg/dL
Specific Gravity, Urine: 1.028 (ref 1.005–1.030)
WBC, UA: >50 WBC/hpf — ABNORMAL HIGH (ref 0–5)
pH: 6 (ref 5.0–8.0)

## 2021-01-01 LAB — CHLAMYDIA/NGC RT PCR (ARMC ONLY)
Chlamydia Tr: NOT DETECTED
N gonorrhoeae: DETECTED — AB

## 2021-01-01 MED ORDER — CEFTRIAXONE SODIUM 1 G IJ SOLR
500.0000 mg | Freq: Once | INTRAMUSCULAR | Status: AC
  Administered 2021-01-01: 500 mg via INTRAMUSCULAR
  Filled 2021-01-01: qty 10

## 2021-01-01 NOTE — Discharge Instructions (Addendum)
This is your second positive test in less than 2 months.  Advised to have a sexual partner tested and treated.

## 2021-01-01 NOTE — ED Triage Notes (Signed)
Pt comes pov requesting std testing. States discharge.

## 2021-01-01 NOTE — ED Provider Notes (Signed)
[facility] Emergency Department Provider Note   ____________________________________________   Event Date/Time   First MD Initiated Contact with Patient 01/01/21 1040     (approximate)  I have reviewed the triage vital signs and the nursing notes.   HISTORY  Chief Complaint Exposure to STD    HPI Austin Herrera is a 39 y.o. male patient complain of dysuria and urethral discharge for 2 days.  Patient last sexual contact was approximate 5 days ago.  Patient denies any lesions or fever.  Patient was seen last 2 months ago and diagnosed and treated for gonorrhea.  Patient was negative for chlamydia.         History reviewed. No pertinent past medical history.  There are no problems to display for this patient.   History reviewed. No pertinent surgical history.  Prior to Admission medications   Not on File    Allergies Patient has no known allergies.  History reviewed. No pertinent family history.  Social History Social History   Tobacco Use   Smoking status: Every Day    Packs/day: 0.25    Types: Cigarettes   Smokeless tobacco: Never  Substance Use Topics   Alcohol use: Yes    Comment: socially    Review of Systems Constitutional: No fever/chills Eyes: No visual changes. ENT: No sore throat. Cardiovascular: Denies chest pain. Respiratory: Denies shortness of breath. Gastrointestinal: No abdominal pain.  No nausea, no vomiting.  No diarrhea.  No constipation. Genitourinary: Positive for dysuria. Musculoskeletal: Negative for back pain. Skin: Negative for rash. Neurological: Negative for headaches, focal weakness or numbness. ____________________________________________   PHYSICAL EXAM:  VITAL SIGNS: ED Triage Vitals [01/01/21 0928]  Enc Vitals Group     BP (!) 147/87     Pulse Rate 63     Resp 18     Temp 98.1 F (36.7 C)     Temp Source Oral     SpO2 99 %     Weight 175 lb (79.4 kg)     Height 6' (1.829 m)      Head Circumference      Peak Flow      Pain Score 0     Pain Loc      Pain Edu?      Excl. in [place]?    Constitutional: Alert and oriented. Well appearing and in no acute distress. Cardiovascular: Normal rate, regular rhythm. Grossly normal heart sounds.  Good peripheral circulation. Respiratory: Normal respiratory effort.  No retractions. Lungs CTAB. Gastrointestinal: Soft and nontender. No distention. No abdominal bruits. No CVA tenderness. Genitourinary: No obvious lesions or current discharge. Musculoskeletal: No lower extremity tenderness nor edema.  No joint effusions. Neurologic:  Normal speech and language. No gross focal neurologic deficits are appreciated. No gait instability. Skin:  Skin is warm, dry and intact. No rash noted. Psychiatric: Mood and affect are normal. Speech and behavior are normal.  ____________________________________________   LABS (all labs ordered are listed, but only abnormal results are displayed)  Labs Reviewed  CHLAMYDIA/NGC RT PCR ([facility] ONLY)           - Abnormal; Notable for the following components:      Result Value   N gonorrhoeae DETECTED (*)    All other components within normal limits  URINALYSIS, COMPLETE (UACMP) WITH MICROSCOPIC - Abnormal; Notable for the following components:   Color, Urine YELLOW (*)    APPearance HAZY (*)    Ketones, ur 5 (*)    Leukocytes,Ua  MODERATE (*)    WBC, UA >50 (*)    All other components within normal limits   ____________________________________________  EKG   ____________________________________________  RADIOLOGY I, Joni Reining, personally viewed and evaluated these images (plain radiographs) as part of my medical decision making, as well as reviewing the written report by the radiologist.  ED MD interpretation:    Official radiology report(s): No results found.  ____________________________________________   PROCEDURES  Procedure(s) performed (including Critical  Care):  Procedures   ____________________________________________   INITIAL IMPRESSION / ASSESSMENT AND PLAN / ED COURSE  As part of my medical decision making, I reviewed the following data within the electronic MEDICAL RECORD NUMBER         Patient presents for reevaluation of STD.  Patient test positive for gonorrhea.  Patient was negative for chlamydia.  Patient was given Rocephin 500 mg IM.  Patient advised to have his sexual partner tested and treated as needed.  Patient advised to follow-up with the [facility] department.      ____________________________________________   FINAL CLINICAL IMPRESSION(S) / ED DIAGNOSES  Final diagnoses:  Gonorrhea     ED Discharge Orders     None        Note:  This document was prepared using Dragon voice recognition software and may include unintentional dictation errors.    Joni Reining, PA-C 01/01/21 1508    Delton Pra[place], MD 01/01/21 1539

## 2021-10-11 ENCOUNTER — Ambulatory Visit
Admission: EM | Admit: 2021-10-11 | Discharge: 2021-10-11 | Disposition: A | Discharge status: Home / Self Care | Payer: Self-pay | Attending: Family Medicine | Admitting: Family Medicine

## 2021-10-11 DIAGNOSIS — M545 Low back pain, unspecified: Secondary | ICD-10-CM

## 2021-10-11 MED ORDER — CYCLOBENZAPRINE HCL 10 MG PO TABS: 10.0000 mg | ORAL_TABLET | Freq: Two times a day (BID) | ORAL | 0 refills | Status: DC | PRN

## 2021-10-11 MED ORDER — INDOMETHACIN 50 MG PO CAPS: 50.0000 mg | ORAL_CAPSULE | Freq: Two times a day (BID) | ORAL | 0 refills | Status: AC

## 2021-10-11 NOTE — ED Provider Notes (Signed)
Austin Herrera    CSN: [id number] Arrival date & time: 10/11/21  0300      History   Chief Complaint Chief Complaint  Patient presents with   Back Pain    HPI Austin Herrera is a 41 y.o. male.   HPI Patient presents with a 2-3 month history of back pain. Pain is localized to the bilateral lower back . Pain occasional radiates to the hip area. 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 Pain is worse with lying down and getting out of bed. Overnight his back pain seemed to worsen. No known prior history DDD or prior injury. He has taken ASA daily for pain with minimal relief.  No past medical history on file.  There are no problems to display for this patient.   No past surgical history on file.     Home Medications    Prior to Admission medications   Medication Sig Start Date End Date Taking? Authorizing Provider  cyclobenzaprine (FLEXERIL) 10 MG tablet Take 1 tablet (10 mg total) by mouth 2 (two) times daily as needed for muscle spasms (back pain). 10/11/21  Yes Bing Neighbors, FNP  indomethacin (INDOCIN) 50 MG capsule Take 1 capsule (50 mg total) by mouth 2 (two) times daily with a meal for 7 days. 10/11/21 10/18/21 Yes Bing Neighbors, FNP    Family History No family history on file.  Social History Social History   Tobacco Use   Smoking status: Every Day    Packs/day: 0.25    Types: Cigarettes   Smokeless tobacco: Never  Substance Use Topics   Alcohol use: Yes    Comment: socially     Allergies   Patient has no known allergies.   Review of Systems Review of Systems Pertinent negatives listed in HPI   Physical Exam Triage Vital Signs ED Triage Vitals [10/11/21 0940]  Enc Vitals Group     BP (!) 141/89     Pulse Rate 67     Resp 16     Temp 98.1 F (36.7 C)     Temp Source Oral     SpO2 100 %     Weight      Height      Head Circumference      Peak Flow       Pain Score      Pain Loc      Pain Edu?      Excl. in [place]?    No data found.  Updated Vital Signs BP (!) 141/89 (BP Location: Right Arm)   Pulse 67   Temp 98.1 F (36.7 C) (Oral)   Resp 16   SpO2 100%   Visual Acuity Right Eye Distance:   Left Eye Distance:   Bilateral Distance:    Right Eye Near:   Left Eye Near:    Bilateral Near:     Physical Exam Constitutional:      Appearance: Normal appearance.  HENT:     Head: Normocephalic and atraumatic.  Eyes:     Extraocular Movements: Extraocular movements intact.     Pupils: Pupils are equal, round, and reactive to light.  Cardiovascular:     Rate and Rhythm: Normal rate and regular rhythm.  Pulmonary:     Effort: Pulmonary effort is normal.     Breath sounds: Normal breath sounds.  Musculoskeletal:     Cervical back: Normal range of  motion.     Lumbar back: No swelling, spasms, tenderness or bony tenderness. Decreased range of motion. Positive right straight leg raise test and positive left straight leg raise test. No scoliosis.  Skin:    General: Skin is warm and dry.  Neurological:     General: No focal deficit present.     Mental Status: He is alert and oriented to person, place, and time.     UC Treatments / Results  Labs (all labs ordered are listed, but only abnormal results are displayed) Labs Reviewed - No data to display  EKG   Radiology No results found.  Procedures Procedures (including critical care time)  Medications Ordered in UC Medications - No data to display  Initial Impression / Assessment and Plan / UC Course  I have reviewed the triage vital signs and the nursing notes.  Pertinent labs & imaging results that were available during my care of the patient were reviewed by me and considered in my medical decision making (see chart for details).    Acute low back pain treatment with indomethacin twice daily for 7 days.  Advised to take medication with food.  Advised to discontinue  ASA recommend OTC management with naproxen to avoid the risk of GI upset and GI bleed.  Advised to take neither while taking current indomethacin.  Cyclobenzaprine as needed for acute pain.  Patient advised that medication causes drowsiness therefore avoid taking while driving or working.  Return precautions given if symptoms do not readily improve. Final Clinical Impressions(s) / UC Diagnoses   Final diagnoses:  Acute bilateral low back pain without sciatica     Discharge Instructions      Take indomethacin with food twice daily for 7 days to reduce inflammation and pain related to back pain. Muscle relaxer can be taken as needed.      ED Prescriptions     Medication Sig Dispense Auth. Provider   indomethacin (INDOCIN) 50 MG capsule Take 1 capsule (50 mg total) by mouth 2 (two) times daily with a meal for 7 days. 14 capsule Bing Neighbors, FNP   cyclobenzaprine (FLEXERIL) 10 MG tablet Take 1 tablet (10 mg total) by mouth 2 (two) times daily as needed for muscle spasms (back pain). 30 tablet Bing Neighbors, FNP      PDMP not reviewed this encounter.   Bing Neighbors, FNP 10/11/21 1027

## 2021-10-11 NOTE — Discharge Instructions (Addendum)
Take indomethacin with food twice daily for 7 days to reduce inflammation and pain related to back pain. Muscle relaxer can be taken as needed.

## 2021-10-11 NOTE — ED Triage Notes (Signed)
Pt presents with lower Back pain x 2-3 months. Pt denies any injury.

## 2021-11-28 ENCOUNTER — Ambulatory Visit
Admission: EM | Admit: 2021-11-28 | Discharge: 2021-11-28 | Disposition: A | Discharge status: Home / Self Care | Payer: No Typology Code available for payment source | Attending: Emergency Medicine | Admitting: Emergency Medicine

## 2021-11-28 DIAGNOSIS — M545 Low back pain, unspecified: Secondary | ICD-10-CM | POA: Diagnosis not present

## 2021-11-28 LAB — POCT URINALYSIS DIP (MANUAL ENTRY)
Bilirubin, UA: NEGATIVE
Blood, UA: NEGATIVE
Glucose, UA: NEGATIVE mg/dL
Ketones, POC UA: NEGATIVE mg/dL
Leukocytes, UA: NEGATIVE
Nitrite, UA: NEGATIVE
Protein Ur, POC: NEGATIVE mg/dL
Spec Grav, UA: >=1.03 — AB (ref 1.010–1.025)
Urobilinogen, UA: 0.2 E.U./dL
pH, UA: 5.5 (ref 5.0–8.0)

## 2021-11-28 MED ORDER — METHOCARBAMOL 500 MG PO TABS: 500.0000 mg | ORAL_TABLET | Freq: Two times a day (BID) | ORAL | 0 refills | Status: DC

## 2021-11-28 NOTE — Discharge Instructions (Addendum)
Take ibuprofen as needed for discomfort.  Take the muscle relaxer as needed for muscle spasm; Do not drive, operate machinery, or drink alcohol with this medication as it can cause drowsiness.   Follow up with your primary care provider or an orthopedist if your symptoms are not improving.     

## 2021-11-28 NOTE — ED Triage Notes (Signed)
Patient presents to Urgent Care with complaints of intermittent lower back pain x 05/23. States worse the last 4 days. Requesting referral to ortho. States not taking any meds.

## 2021-11-28 NOTE — ED Provider Notes (Signed)
Austin Herrera    CSN: [id number] Arrival date & time: 11/28/21  1916      History   Chief Complaint Chief Complaint  Patient presents with   Back Pain    HPI Austin Herrera is a 41 y.o. male.  Patient presents with bilateral low back pain for several months.  No falls or injury.  He denies numbness, weakness, paresthesias, saddle anesthesia, loss of bowel/bladder control, fever, chills, abdominal pain, dysuria, hematuria, penile discharge, testicular pain, or other symptoms.  No OTC medications taken today.  Patient was seen at this urgent care on 10/11/2021; diagnosed with acute bilateral low back pain without sciatica; treated with cyclobenzaprine and indomethacin.  The history is provided by the patient and medical records.    History reviewed. No pertinent past medical history.  There are no problems to display for this patient.   History reviewed. No pertinent surgical history.     Home Medications    Prior to Admission medications   Medication Sig Start Date End Date Taking? Authorizing Provider  methocarbamol (ROBAXIN) 500 MG tablet Take 1 tablet (500 mg total) by mouth 2 (two) times daily. 11/28/21  Yes Mickie Bail, NP    Family History History reviewed. No pertinent family history.  Social History Social History   Tobacco Use   Smoking status: Every Day    Packs/day: 0.25    Types: Cigarettes   Smokeless tobacco: Never  Substance Use Topics   Alcohol use: Yes    Comment: socially     Allergies   Other   Review of Systems Review of Systems  Constitutional:  Negative for chills and fever.  Respiratory:  Negative for cough and shortness of breath.   Cardiovascular:  Negative for chest pain and palpitations.  Gastrointestinal:  Negative for abdominal pain, constipation, diarrhea, nausea and vomiting.  Genitourinary:  Negative for dysuria, hematuria, penile discharge and testicular pain.  Musculoskeletal:  Positive for back pain. Negative  for arthralgias, gait problem and joint swelling.  Skin:  Negative for color change and rash.  Neurological:  Negative for weakness and numbness.  All other systems reviewed and are negative.    Physical Exam Triage Vital Signs ED Triage Vitals [11/28/21 1928]  Enc Vitals Group     BP 133/83     Pulse Rate 84     Resp 18     Temp 98.3 F (36.8 C)     Temp src      SpO2 98 %     Weight      Height      Head Circumference      Peak Flow      Pain Score      Pain Loc      Pain Edu?      Excl. in GC?    No data found.  Updated Vital Signs BP 133/83   Pulse 84   Temp 98.3 F (36.8 C)   Resp 18   SpO2 98%   Visual Acuity Right Eye Distance:   Left Eye Distance:   Bilateral Distance:    Right Eye Near:   Left Eye Near:    Bilateral Near:     Physical Exam Vitals and nursing note reviewed.  Constitutional:      General: He is not in acute distress.    Appearance: Normal appearance. He is well-developed. He is not ill-appearing.  HENT:     Mouth/Throat:     Mouth: Mucous membranes are moist.  Cardiovascular:     Rate and Rhythm: Normal rate and regular rhythm.     Heart sounds: Normal heart sounds.  Pulmonary:     Effort: Pulmonary effort is normal. No respiratory distress.     Breath sounds: Normal breath sounds.  Abdominal:     General: Bowel sounds are normal. There is no distension.     Palpations: Abdomen is soft.     Tenderness: There is no abdominal tenderness. There is no right CVA tenderness, left CVA tenderness, guarding or rebound.  Musculoskeletal:        General: No swelling, tenderness or deformity. Normal range of motion.     Cervical back: Neck supple.  Skin:    General: Skin is warm and dry.     Capillary Refill: Capillary refill takes less than 2 seconds.     Findings: No bruising, erythema, lesion or rash.  Neurological:     General: No focal deficit present.     Mental Status: He is alert and oriented to person, place, and time.      Sensory: No sensory deficit.     Motor: No weakness.     Gait: Gait normal.  Psychiatric:        Mood and Affect: Mood normal.        Behavior: Behavior normal.      UC Treatments / Results  Labs (all labs ordered are listed, but only abnormal results are displayed) Labs Reviewed  POCT URINALYSIS DIP (MANUAL ENTRY) - Abnormal; Notable for the following components:      Result Value   Spec Grav, UA >=1.030 (*)    All other components within normal limits    EKG   Radiology No results found.  Procedures Procedures (including critical care time)  Medications Ordered in UC Medications - No data to display  Initial Impression / Assessment and Plan / UC Course  I have reviewed the triage vital signs and the nursing notes.  Pertinent labs & imaging results that were available during my care of the patient were reviewed by me and considered in my medical decision making (see chart for details).  Bilateral low back pain without sciatica.  Treating with ibuprofen and Robaxin.  Precautions for drowsiness with Robaxin discussed.  Instructed patient to follow-up with an orthopedist and contact information for on-call Ortho provided.  Education provided on low back pain.  Work note provided per patient request.  He agrees to plan of care.   Final Clinical Impressions(s) / UC Diagnoses   Final diagnoses:  Bilateral low back pain without sciatica, unspecified chronicity     Discharge Instructions      Take ibuprofen as needed for discomfort.  Take the muscle relaxer as needed for muscle spasm; Do not drive, operate machinery, or drink alcohol with this medication as it can cause drowsiness.   Follow up with your primary care provider or an orthopedist if your symptoms are not improving.         ED Prescriptions     Medication Sig Dispense Auth. Provider   methocarbamol (ROBAXIN) 500 MG tablet Take 1 tablet (500 mg total) by mouth 2 (two) times daily. 20 tablet Mickie Bail, NP      PDMP not reviewed this encounter.   Mickie Bail, NP 11/28/21 [phone]

## 2022-03-06 ENCOUNTER — Other Ambulatory Visit: Payer: Self-pay

## 2022-03-06 ENCOUNTER — Encounter (HOSPITAL_COMMUNITY): Payer: Self-pay

## 2022-03-06 ENCOUNTER — Emergency Department (HOSPITAL_COMMUNITY)
Admission: EM | Admit: 2022-03-06 | Discharge: 2022-03-06 | Disposition: A | Discharge status: Home / Self Care | Payer: No Typology Code available for payment source | Attending: Emergency Medicine | Admitting: Emergency Medicine

## 2022-03-06 DIAGNOSIS — T7840XA Allergy, unspecified, initial encounter: Secondary | ICD-10-CM

## 2022-03-06 DIAGNOSIS — Z91010 Allergy to peanuts: Secondary | ICD-10-CM | POA: Diagnosis not present

## 2022-03-06 DIAGNOSIS — L298 Other pruritus: Secondary | ICD-10-CM | POA: Insufficient documentation

## 2022-03-06 DIAGNOSIS — T7801XA Anaphylactic reaction due to peanuts, initial encounter: Secondary | ICD-10-CM | POA: Insufficient documentation

## 2022-03-06 MED ORDER — EPINEPHRINE 0.3 MG/0.3ML IJ SOAJ: 0.3000 mg | Freq: Once | INTRAMUSCULAR | Status: DC

## 2022-03-06 MED ORDER — EPINEPHRINE 0.3 MG/0.3ML IJ SOAJ: 0.3000 mg | INTRAMUSCULAR | 0 refills | Status: DC | PRN

## 2022-03-06 MED ORDER — FAMOTIDINE 20 MG PO TABS
40.0000 mg | ORAL_TABLET | Freq: Once | ORAL | Status: AC
  Administered 2022-03-06: 40 mg via ORAL
  Filled 2022-03-06: qty 2

## 2022-03-06 MED ORDER — METHYLPREDNISOLONE SODIUM SUCC 125 MG IJ SOLR: 125.0000 mg | Freq: Once | INTRAMUSCULAR | Status: DC

## 2022-03-06 MED ORDER — DIPHENHYDRAMINE HCL 25 MG PO CAPS
25.0000 mg | ORAL_CAPSULE | Freq: Once | ORAL | Status: AC
  Administered 2022-03-06: 25 mg via ORAL
  Filled 2022-03-06: qty 1

## 2022-03-06 MED ORDER — PREDNISONE 20 MG PO TABS
60.0000 mg | ORAL_TABLET | Freq: Once | ORAL | Status: AC
  Administered 2022-03-06: 60 mg via ORAL
  Filled 2022-03-06: qty 3

## 2022-03-06 MED ORDER — PREDNISONE 20 MG PO TABS: ORAL_TABLET | ORAL | 0 refills | Status: DC

## 2022-03-06 MED ORDER — FAMOTIDINE IN NACL 20-0.9 MG/50ML-% IV SOLN: 20.0000 mg | Freq: Once | INTRAVENOUS | Status: DC

## 2022-03-06 MED ORDER — DIPHENHYDRAMINE HCL 50 MG/ML IJ SOLN: 25.0000 mg | Freq: Once | INTRAMUSCULAR | Status: DC

## 2022-03-06 NOTE — Discharge Instructions (Signed)
Return for worsening symptoms,  difficulty breathing, vomiting, feeling like you might pass out.

## 2022-03-06 NOTE — ED Provider Notes (Signed)
[place] DEPT Provider Note   CSN: [id number] Arrival date & time: 03/06/22  2113     History  Chief Complaint  Patient presents with   Allergic Reaction    Austin Herrera is a 41 y.o. male.  41 yo M with chief complaints of an allergic reaction.  The patient had both shrimp and peanuts today.  He thinks peanuts caused the reaction developed diffuse itchy skin and felt his lips are swollen.  I will wait for like he was wheezing.  Denies nausea or vomiting today feeling like he might pass out.  Has had allergic reactions to nuts in the past.  Is never been formally tested.  He took a Benadryl at home and actually feels much better now.   Allergic Reaction      Home Medications Prior to Admission medications   Medication Sig Start Date End Date Taking? Authorizing Provider  EPINEPHrine 0.3 mg/0.3 mL IJ SOAJ injection Inject 0.3 mg into the muscle as needed for anaphylaxis. 03/06/22  Yes Deno Etienne, DO  predniSONE (DELTASONE) 20 MG tablet 2 tabs po daily x 4 days 03/06/22  Yes Deno Etienne, DO  methocarbamol (ROBAXIN) 500 MG tablet Take 1 tablet (500 mg total) by mouth 2 (two) times daily. 11/28/21   Sharion Balloon, NP      Allergies    Other and Peanut-containing drug products    Review of Systems   Review of Systems  Physical Exam Updated Vital Signs BP 101/72   Pulse 84   Temp 97.7 F (36.5 C) (Oral)   Resp 14   Ht 6' (1.829 m)   Wt 95.3 kg   SpO2 98%   BMI 28.48 kg/m  Physical Exam Vitals and nursing note reviewed.  Constitutional:      Appearance: He is well-developed.  HENT:     Head: Normocephalic and atraumatic.  Eyes:     Pupils: Pupils are equal, round, and reactive to light.  Neck:     Vascular: No JVD.  Cardiovascular:     Rate and Rhythm: Normal rate and regular rhythm.     Heart sounds: No murmur heard.    No friction rub. No gallop.  Pulmonary:     Effort: No respiratory distress.     Breath sounds: No  wheezing.  Abdominal:     General: There is no distension.     Tenderness: There is no abdominal tenderness. There is no guarding or rebound.  Musculoskeletal:        General: Normal range of motion.     Cervical back: Normal range of motion and neck supple.  Skin:    Coloration: Skin is not pale.     Findings: No rash.  Neurological:     Mental Status: He is alert and oriented to person, place, and time.  Psychiatric:        Behavior: Behavior normal.     ED Results / Procedures / Treatments   Labs (all labs ordered are listed, but only abnormal results are displayed) Labs Reviewed - No data to display  EKG EKG Interpretation  Date/Time:  Monday March 06 2022 21:18:59 EDT Ventricular Rate:  118 PR Interval:  154 QRS Duration: 77 QT Interval:  295 QTC Calculation: 414 R Axis:   86 Text Interpretation: Sinus tachycardia Biatrial enlargement Borderline T wave abnormalities No old tracing to compare Confirmed by Deno Etienne [id number]) on 03/06/2022 9:38:13 PM  Radiology No results found.  Procedures Procedures  Medications Ordered in ED Medications  diphenhydrAMINE (BENADRYL) capsule 25 mg (25 mg Oral Given 03/06/22 2235)  famotidine (PEPCID) tablet 40 mg (40 mg Oral Given 03/06/22 2235)  predniSONE (DELTASONE) tablet 60 mg (60 mg Oral Given 03/06/22 2235)    ED Course/ Medical Decision Making/ A&P                           Medical Decision Making Risk Prescription drug management.   41 yo M with a chief complaints of a diffuse itchy rash.  Occurred after eating.  He is not sure if it was due to shrimp or peanuts.  He tells me he is improved rapidly in the past 4 hours or so.  Has clear lung sounds for me.  No nausea no vomiting no hypotension.  I discussed further observation in the emergency department which she is declining.  We will prescribe him an EpiPen for home.  Burst dose of steroids.  PCP follow-up.  10:38 PM:  I have discussed the  diagnosis/risks/treatment options with the patient and family.  Evaluation and diagnostic testing in the emergency department does not suggest an emergent condition requiring admission or immediate intervention beyond what has been performed at this time.  They will follow up with PCP. We also discussed returning to the ED immediately if new or worsening sx occur. We discussed the sx which are most concerning (e.g., sudden worsening pain, fever, inability to tolerate by mouth, syncope, vomiting, diarrhea, sob) that necessitate immediate return. Medications administered to the patient during their visit and any new prescriptions provided to the patient are listed below.  Medications given during this visit Medications  diphenhydrAMINE (BENADRYL) capsule 25 mg (25 mg Oral Given 03/06/22 2235)  famotidine (PEPCID) tablet 40 mg (40 mg Oral Given 03/06/22 2235)  predniSONE (DELTASONE) tablet 60 mg (60 mg Oral Given 03/06/22 2235)     The patient appears reasonably screen and/or stabilized for discharge and I doubt any other medical condition or other [facility] requiring further screening, evaluation, or treatment in the ED at this time prior to discharge.          Final Clinical Impression(s) / ED Diagnoses Final diagnoses:  Allergic reaction, initial encounter    Rx / DC Orders ED Discharge Orders          Ordered    EPINEPHrine 0.3 mg/0.3 mL IJ SOAJ injection  As needed        03/06/22 2210    predniSONE (DELTASONE) 20 MG tablet        03/06/22 2210              Melene Plan, DO 03/06/22 2238

## 2022-03-06 NOTE — ED Triage Notes (Addendum)
Pt states he ate shrimp approx 1 hour ago. Pt denies SOB, denies difficulty swallowing, pt states he feels itchy all over. Pt states he did not know he was allergic to shrimp. Pt A&Ox4, 99% RA.  Suella Broad, PA aware.

## 2022-03-06 NOTE — ED Provider Triage Note (Signed)
Emergency Medicine Provider Triage Evaluation Note  Austin Herrera , a 41 y.o. male  was evaluated in triage.  Pt complains of allergic rxn. Known allergy to peanuts. Ate a snickers bar and then ate shrimp and lips swelled up. Can usually eat a snickers mini. Rash, facial swelling, feels like something stuck in throat, difficulty swallowing. No vomiting.  Took 1 benadryl PTA, does not have an epi pen.  Review of Systems  Positive: As above Negative: As above  Physical Exam  BP 108/85 (BP Location: Left Arm)   Pulse (!) 120   Temp 97.7 F (36.5 C) (Oral)   Resp 16   Ht 6' (1.829 m)   Wt 95.3 kg   SpO2 98%   BMI 28.48 kg/m  Gen:   Awake, no distress   Resp:  Normal effort  MSK:   Moves extremities without difficulty  Other:    Medical Decision Making  Medically screening exam initiated at 9:24 PM.  Appropriate orders placed.  Austin Herrera was informed that the remainder of the evaluation will be completed by another provider, this initial triage assessment does not replace that evaluation, and the importance of remaining in the ED until their evaluation is complete.     Tacy Learn, PA-C 03/06/22 2126

## 2022-03-06 NOTE — ED Notes (Signed)
EDP at bedside to assess pt 

## 2022-07-10 ENCOUNTER — Ambulatory Visit: Payer: No Typology Code available for payment source | Admitting: Family

## 2022-08-11 ENCOUNTER — Telehealth: Payer: Self-pay

## 2022-08-11 ENCOUNTER — Other Ambulatory Visit: Payer: Self-pay | Admitting: Physician Assistant

## 2022-08-11 ENCOUNTER — Encounter: Payer: Self-pay | Admitting: Physician Assistant

## 2022-08-11 ENCOUNTER — Ambulatory Visit: Payer: Medicaid Other | Admitting: Physician Assistant

## 2022-08-11 VITALS — BP 128/78 | HR 84 | Temp 98.0°F | Ht 72.0 in | Wt 206.0 lb

## 2022-08-11 DIAGNOSIS — R3912 Poor urinary stream: Secondary | ICD-10-CM | POA: Diagnosis not present

## 2022-08-11 DIAGNOSIS — R6882 Decreased libido: Secondary | ICD-10-CM

## 2022-08-11 DIAGNOSIS — R350 Frequency of micturition: Secondary | ICD-10-CM

## 2022-08-11 DIAGNOSIS — Z1331 Encounter for screening for depression: Secondary | ICD-10-CM

## 2022-08-11 DIAGNOSIS — N401 Enlarged prostate with lower urinary tract symptoms: Secondary | ICD-10-CM | POA: Diagnosis not present

## 2022-08-11 DIAGNOSIS — Z114 Encounter for screening for human immunodeficiency virus [HIV]: Secondary | ICD-10-CM

## 2022-08-11 LAB — POCT URINALYSIS DIPSTICK
Bilirubin, UA: NEGATIVE
Blood, UA: NEGATIVE
Glucose, UA: NEGATIVE
Ketones, UA: NEGATIVE
Leukocytes, UA: NEGATIVE
Nitrite, UA: NEGATIVE
Protein, UA: NEGATIVE
Spec Grav, UA: 1.025 (ref 1.010–1.025)
Urobilinogen, UA: 0.2 E.U./dL
pH, UA: 6 (ref 5.0–8.0)

## 2022-08-11 MED ORDER — TAMSULOSIN HCL 0.4 MG PO CAPS: 0.4000 mg | ORAL_CAPSULE | Freq: Every day | ORAL | 0 refills | Status: DC

## 2022-08-11 NOTE — Progress Notes (Signed)
Date:  08/11/2022   Name:  Austin Herrera   DOB:  07/23/1980   MRN:  [id number]   Chief Complaint: Grand Cane and Urinary Frequency  HPI [name] is a pleasant 42 year old male new to the practice today joined by his girlfriend to establish care and evaluate urinary complaints of nocturia (1-3x/night), frequency, urgency, hesitancy, dribbling, and weak urinary stream for the past 47mo. He has been lost to primary care for many years and cannot remember his last physical.  Of note, he had positive tests for gonorrhea on 2 separate occasions in 2022, but says this does not feel like that.  No history of kidney stones.  He does endorse chronic intermittent back pain which sounds musculoskeletal in nature.  He denies fever, chills, nausea, fatigue, weight loss, dysuria, hematuria, penile discharge, or bowel changes.  Family history of prostate cancer in [place].   Recent Labs     Component Value Date/Time   NA 139 06/30/2015 1623   K 3.9 06/30/2015 1623   CL 108 06/30/2015 1623   CO2 21 (L) 06/30/2015 1623   GLUCOSE 123 (H) 06/30/2015 1623   BUN 13 06/30/2015 1623   CREATININE 1.10 06/30/2015 1623   CALCIUM 9.3 06/30/2015 1623   PROT 7.5 06/30/2015 1623   ALBUMIN 4.5 06/30/2015 1623   AST 30 06/30/2015 1623   ALT 19 06/30/2015 1623   ALKPHOS 108 06/30/2015 1623   BILITOT 0.9 06/30/2015 1623   GFRNONAA >60 06/30/2015 1623   GFRAA >60 06/30/2015 1623    Lab Results  Component Value Date   WBC 8.6 06/30/2015   HGB 16.8 06/30/2015   HCT 50.3 06/30/2015   MCV 83.5 06/30/2015   PLT 279 06/30/2015   No results found for: "HGBA1C" No results found for: "CHOL", "HDL", "LDLCALC", "LDLDIRECT", "TRIG", "CHOLHDL" No results found for: "TSH"  Review of Systems  Genitourinary:  Positive for difficulty urinating, frequency and urgency. Negative for dysuria, flank pain, hematuria, penile discharge and penile pain.    Patient Active Problem List   Diagnosis Date Noted   Benign localized  prostatic hyperplasia with lower urinary tract symptoms (LUTS) 08/11/2022   Positive screening for depression on 9-item Patient Health Questionnaire (PHQ-9) 08/11/2022    Allergies  Allergen Reactions   Other Itching   Peanut-Containing Drug Products     History reviewed. No pertinent surgical history.  Social History   Tobacco Use   Smoking status: Every Day    Packs/day: 0.50    Years: 20.00    Additional pack years: 0.00    Total pack years: 10.00    Types: Cigarettes   Smokeless tobacco: Never  Vaping Use   Vaping Use: Never used  Substance Use Topics   Alcohol use: Yes    Comment: socially   Drug use: Yes    Types: Marijuana     Medication list has been reviewed and updated.  Current Meds  Medication Sig   tamsulosin (FLOMAX) 0.4 MG CAPS capsule Take 1 capsule (0.4 mg total) by mouth daily.       08/11/2022    8:55 AM  GAD 7 : Generalized Anxiety Score  Nervous, Anxious, on Edge 2  Control/stop worrying 2  Worry too much - different things 2  Trouble relaxing 3  Restless 3  Easily annoyed or irritable 3  Afraid - awful might happen 1  Total GAD 7 Score 16  Anxiety Difficulty Very difficult       08/11/2022    8:55  AM  Depression screen PHQ 2/9  Decreased Interest 2  Down, Depressed, Hopeless 3  PHQ - 2 Score 5  Altered sleeping 2  Tired, decreased energy 2  Change in appetite 0  Feeling bad or failure about yourself  2  Trouble concentrating 1  Moving slowly or fidgety/restless 0  Suicidal thoughts 0  PHQ-9 Score 12  Difficult doing work/chores Very difficult    BP Readings from Last 3 Encounters:  08/11/22 128/78  03/06/22 101/72  11/28/21 133/83    Physical Exam Vitals and nursing note reviewed.  Constitutional:      Appearance: Normal appearance.  Cardiovascular:     Rate and Rhythm: Normal rate and regular rhythm.     Heart sounds: Normal heart sounds.  Pulmonary:     Effort: Pulmonary effort is normal.     Breath sounds:  Normal breath sounds.  Abdominal:     Tenderness: There is no abdominal tenderness. There is no right CVA tenderness or left CVA tenderness.  Genitourinary:    Prostate: Enlarged. Not tender and no nodules present.     Rectum: Normal.     Comments: Mildly enlarged, nontender prostate without nodularity or masses.    Wt Readings from Last 3 Encounters:  08/11/22 206 lb (93.4 kg)  03/06/22 210 lb (95.3 kg)  01/01/21 175 lb (79.4 kg)    BP 128/78   Pulse 84   Temp 98 F (36.7 C) (Oral)   Ht 6' (1.829 m)   Wt 206 lb (93.4 kg)   SpO2 97%   BMI 27.94 kg/m   Assessment and Plan:  1. Benign localized prostatic hyperplasia with lower urinary tract symptoms (LUTS) Checking BMP and PSA (lab work obtained prior to prostate exam).  Symptoms and physical consistent with BPH. Trial of tamsulosin w/ f/u in 3m for CPE. - tamsulosin (FLOMAX) 0.4 MG CAPS capsule; Take 1 capsule (0.4 mg total) by mouth daily.  Dispense: 30 capsule; Refill: 0  2. Weak urine stream Urine dipstick negative for UTI.  Sending urine for GC as well given hx gonorrhea. Low suspicion for urethritis based on history and dipstick, low suspicion for chronic prostatitis based on exam.  - PSA - Basic metabolic panel - GC/Chlamydia Probe Amp  3. Urinary frequency Plan as above - POCT urinalysis dipstick - GC/Chlamydia Probe Amp  4. Screening for HIV (human immunodeficiency virus) Completing one-time HIV screening today with other lab work. - HIV antibody (with reflex)  5. Positive screening for depression on 9-item Patient Health Questionnaire (PHQ-9) Discussed with patient.  He tells me he is not depressed right now, but would just like "someone to talk to."  Not interested in medications at this time.  Placed referral for counseling - Ambulatory referral to Psychology  6. Low libido/ED At the last minute, patient mentions gradually decreased libido and mild ED, desires testosterone screening. Could be vascular  (smoking), psychological (depression), endocrine (low T), or related to BPH. Will try to add-on total testosterone to the labs drawn today. Advised tamsulosin may or may not help with this problem. Also advised healthy sleep, physical activity, and moving toward a more plant-based diet if possible. Will discuss further at future visit.    Return in about 1 month (around 09/11/2022) for fasting CPE.   Partially dictated using Editor, commissioning. Any errors are unintentional.  Lupita Leash, PA-C, [facility]

## 2022-08-11 NOTE — Telephone Encounter (Signed)
Dan:  Patient had last-minute request to check his testosterone. Fortunately, he was fasting today and it was early morning (<10 am) lab draw. Any chance you could call [facility] to see if we might be able to add on a Serum Total Testosterone to his blood from this morning?

## 2022-08-11 NOTE — Telephone Encounter (Signed)
Testosterone added N40.1.  KP

## 2022-08-12 LAB — BASIC METABOLIC PANEL
BUN/Creatinine Ratio: 10 (ref 9–20)
BUN: 12 mg/dL (ref 6–24)
CO2: 22 mmol/L (ref 20–29)
Calcium: 9.6 mg/dL (ref 8.7–10.2)
Chloride: 108 mmol/L — ABNORMAL HIGH (ref 96–106)
Creatinine, Ser: 1.22 mg/dL (ref 0.76–1.27)
Glucose: 95 mg/dL (ref 70–99)
Potassium: 4.4 mmol/L (ref 3.5–5.2)
Sodium: 144 mmol/L (ref 134–144)
eGFR: 76 mL/min/{1.73_m2} (ref >59)

## 2022-08-12 LAB — HIV ANTIBODY (ROUTINE TESTING W REFLEX): HIV Screen 4th Generation wRfx: NONREACTIVE

## 2022-08-12 LAB — PSA: Prostate Specific Ag, Serum: 0.9 ng/mL (ref 0.0–4.0)

## 2022-08-14 ENCOUNTER — Encounter: Payer: Self-pay | Admitting: Family

## 2022-08-14 ENCOUNTER — Ambulatory Visit: Payer: No Typology Code available for payment source | Admitting: Family

## 2022-08-14 LAB — TESTOSTERONE: Testosterone: 530 ng/dL (ref 264–916)

## 2022-08-14 LAB — SPECIMEN STATUS REPORT

## 2022-08-14 NOTE — Progress Notes (Signed)
PC to pt, unable LVM to call back for results. PEC may give resutls CRM created.

## 2022-08-16 LAB — GC/CHLAMYDIA PROBE AMP
Chlamydia trachomatis, NAA: NEGATIVE
Neisseria Gonorrhoeae by PCR: NEGATIVE

## 2022-08-16 NOTE — Progress Notes (Signed)
Pt called unable to reach patient.  Labs printed and mailed.  KP

## 2022-09-13 ENCOUNTER — Encounter: Payer: Medicaid Other | Admitting: Physician Assistant

## 2022-09-13 NOTE — Progress Notes (Deleted)
Date:  09/13/2022   Name:  Austin Herrera   DOB:  July 27, 1980   MRN:  [id number]   Chief Complaint: No chief complaint on file.  HPI  Austin Herrera is a 42 y.o. male who presents today for his Complete Annual Exam. He feels {DESC; WELL/FAIRLY WELL/POORLY:18703}. He reports exercising ***. He reports he is sleeping {DESC; WELL/FAIRLY WELL/POORLY:18703}.   Colonoscopy: ***  Immunization History  Administered Date(s) Administered   Pneumococcal Polysaccharide-23 07/02/2015   Tdap 07/02/2015   Health Maintenance Due  Topic Date Due   COVID-19 Vaccine (1) Never done   Hepatitis C Screening  Never done    Lab Results  Component Value Date   PSA1 0.9 08/11/2022         Recent Labs     Component Value Date/Time   NA 144 08/11/2022 0947   K 4.4 08/11/2022 0947   CL 108 (H) 08/11/2022 0947   CO2 22 08/11/2022 0947   GLUCOSE 95 08/11/2022 0947   GLUCOSE 123 (H) 06/30/2015 1623   BUN 12 08/11/2022 0947   CREATININE 1.22 08/11/2022 0947   CALCIUM 9.6 08/11/2022 0947   PROT 7.5 06/30/2015 1623   ALBUMIN 4.5 06/30/2015 1623   AST 30 06/30/2015 1623   ALT 19 06/30/2015 1623   ALKPHOS 108 06/30/2015 1623   BILITOT 0.9 06/30/2015 1623   GFRNONAA >60 06/30/2015 1623   GFRAA >60 06/30/2015 1623    Lab Results  Component Value Date   WBC 8.6 06/30/2015   HGB 16.8 06/30/2015   HCT 50.3 06/30/2015   MCV 83.5 06/30/2015   PLT 279 06/30/2015   No results found for: "HGBA1C" No results found for: "CHOL", "HDL", "LDLCALC", "LDLDIRECT", "TRIG", "CHOLHDL" No results found for: "TSH"  Review of Systems  Patient Active Problem List   Diagnosis Date Noted   Benign localized prostatic hyperplasia with lower urinary tract symptoms (LUTS) 08/11/2022   Positive screening for depression on 9-item Patient Health Questionnaire (PHQ-9) 08/11/2022    Allergies  Allergen Reactions   Other Itching   Peanut-Containing Drug Products     No past surgical history on file.  Social  History   Tobacco Use   Smoking status: Every Day    Packs/day: 0.50    Years: 20.00    Additional pack years: 0.00    Total pack years: 10.00    Types: Cigarettes   Smokeless tobacco: Never  Vaping Use   Vaping Use: Never used  Substance Use Topics   Alcohol use: Yes    Comment: socially   Drug use: Yes    Types: Marijuana     Medication list has been reviewed and updated.  No outpatient medications have been marked as taking for the 09/13/22 encounter (Appointment) with Remo Lipps, PA.       08/11/2022    8:55 AM  GAD 7 : Generalized Anxiety Score  Nervous, Anxious, on Edge 2  Control/stop worrying 2  Worry too much - different things 2  Trouble relaxing 3  Restless 3  Easily annoyed or irritable 3  Afraid - awful might happen 1  Total GAD 7 Score 16  Anxiety Difficulty Very difficult       08/11/2022    8:55 AM  Depression screen PHQ 2/9  Decreased Interest 2  Down, Depressed, Hopeless 3  PHQ - 2 Score 5  Altered sleeping 2  Tired, decreased energy 2  Change in appetite 0  Feeling bad or failure about yourself  2  Trouble concentrating 1  Moving slowly or fidgety/restless 0  Suicidal thoughts 0  PHQ-9 Score 12  Difficult doing work/chores Very difficult    BP Readings from Last 3 Encounters:  08/11/22 128/78  03/06/22 101/72  11/28/21 133/83    Wt Readings from Last 3 Encounters:  08/11/22 206 lb (93.4 kg)  03/06/22 210 lb (95.3 kg)  01/01/21 175 lb (79.4 kg)    There were no vitals taken for this visit.  Physical Exam   Assessment and Plan:  There are no diagnoses linked to this encounter.   No follow-ups on file.   Partially dictated using Animal nutritionist. Any errors are unintentional.  Alvester Morin, PA-C, DMSc, Nutritionist [facility] [phone]

## 2023-04-02 ENCOUNTER — Encounter: Payer: Self-pay | Admitting: Physician Assistant

## 2023-04-02 ENCOUNTER — Ambulatory Visit (INDEPENDENT_AMBULATORY_CARE_PROVIDER_SITE_OTHER): Payer: Medicaid Other | Admitting: Physician Assistant

## 2023-04-02 VITALS — BP 122/84 | HR 74 | Ht 72.0 in | Wt 212.0 lb

## 2023-04-02 DIAGNOSIS — N401 Enlarged prostate with lower urinary tract symptoms: Secondary | ICD-10-CM | POA: Diagnosis not present

## 2023-04-02 DIAGNOSIS — K219 Gastro-esophageal reflux disease without esophagitis: Secondary | ICD-10-CM | POA: Diagnosis not present

## 2023-04-02 DIAGNOSIS — Z0000 Encounter for general adult medical examination without abnormal findings: Secondary | ICD-10-CM | POA: Diagnosis not present

## 2023-04-02 DIAGNOSIS — F17200 Nicotine dependence, unspecified, uncomplicated: Secondary | ICD-10-CM

## 2023-04-02 MED ORDER — TAMSULOSIN HCL 0.4 MG PO CAPS: 0.4000 mg | ORAL_CAPSULE | Freq: Every day | ORAL | 2 refills | Status: DC

## 2023-04-02 NOTE — Progress Notes (Signed)
Date:  04/02/2023   Name:  Austin Herrera   DOB:  Dec 03, 1980   MRN:  [id number]   Chief Complaint: Annual Exam and Neck Pain (X1-2 weeks, Sharp pain in neck )  HPI Auston is here for routine physical but also brings several other complaints.   Thinks he is recovering from strep with symptoms onset 1 week ago, girlfriend with confirmed case, patient has not been evaluated for this but he has much improved. Suspects this may be related to some intermittent neck pains he is having.    Intermittent right-sided chest pains for months, nonexertional, lasts about 1 minute, once or twice per month. Worse with alcohol.   Never picked up the tamsulosin prescription sent 6 months ago. Continues to complain of urinary frequency, urgency, and nocturia 1-2 times nightly.   Still smoking cigarettes and marijuana, thinking about quitting both.   Last Physical: >5y ago Last Dental Exam: Apr 2024 Last Eye Exam: July 2024 Last PSA: 0.29 Jul 2022   Medication list has been reviewed and updated.  No outpatient medications have been marked as taking for the 04/02/23 encounter (Office Visit) with Remo Lipps, PA.     Review of Systems  Constitutional:  Negative for activity change, appetite change, fatigue and unexpected weight change.  HENT:  Negative for dental problem, hearing loss and trouble swallowing.   Eyes:  Negative for visual disturbance.  Respiratory:  Negative for cough, chest tightness, shortness of breath and wheezing.   Cardiovascular:  Negative for chest pain, palpitations and leg swelling.  Gastrointestinal:  Negative for abdominal distention, abdominal pain, blood in stool, constipation and diarrhea.  Endocrine: Negative for polydipsia, polyphagia and polyuria.  Genitourinary:  Positive for frequency and urgency. Negative for difficulty urinating, dysuria, enuresis, hematuria and testicular pain.       Nocturia, weak stream  Musculoskeletal:  Negative for arthralgias, gait  problem and joint swelling.  Skin:  Negative for rash and wound.  Neurological:  Negative for dizziness, syncope, weakness and headaches.  Psychiatric/Behavioral:  Negative for behavioral problems and sleep disturbance.     Patient Active Problem List   Diagnosis Date Noted   Benign localized prostatic hyperplasia with lower urinary tract symptoms (LUTS) 08/11/2022   Positive screening for depression on 9-item Patient Health Questionnaire (PHQ-9) 08/11/2022    Allergies  Allergen Reactions   Other Itching   Peanut-Containing Drug Products     Immunization History  Administered Date(s) Administered   Pneumococcal Polysaccharide-23 07/02/2015   Tdap 07/02/2015    History reviewed. No pertinent surgical history.  Social History   Tobacco Use   Smoking status: Every Day    Current packs/day: 0.50    Average packs/day: 0.5 packs/day for 20.0 years (10.0 ttl pk-yrs)    Types: Cigarettes   Smokeless tobacco: Never  Vaping Use   Vaping status: Never Used  Substance Use Topics   Alcohol use: Yes    Comment: socially   Drug use: Yes    Types: Marijuana    Family History  Problem Relation Age of Onset   Prostate cancer Maternal Grandfather    Cancer Paternal Grandmother         04/02/2023    9:26 AM 08/11/2022    8:55 AM  GAD 7 : Generalized Anxiety Score  Nervous, Anxious, on Edge 0 2  Control/stop worrying 0 2  Worry too much - different things 0 2  Trouble relaxing 0 3  Restless 0 3  Easily annoyed or  irritable 3 3  Afraid - awful might happen 0 1  Total GAD 7 Score 3 16  Anxiety Difficulty Not difficult at all Very difficult       04/02/2023    9:26 AM 08/11/2022    8:55 AM  Depression screen PHQ 2/9  Decreased Interest 0 2  Down, Depressed, Hopeless 0 3  PHQ - 2 Score 0 5  Altered sleeping 3 2  Tired, decreased energy 0 2  Change in appetite 0 0  Feeling bad or failure about yourself  0 2  Trouble concentrating 0 1  Moving slowly or  fidgety/restless 0 0  Suicidal thoughts 0 0  PHQ-9 Score 3 12  Difficult doing work/chores Somewhat difficult Very difficult    BP Readings from Last 3 Encounters:  04/02/23 122/84  08/11/22 128/78  03/06/22 101/72    Wt Readings from Last 3 Encounters:  04/02/23 212 lb (96.2 kg)  08/11/22 206 lb (93.4 kg)  03/06/22 210 lb (95.3 kg)    BP 122/84   Pulse 74   Ht 6' (1.829 m)   Wt 212 lb (96.2 kg)   SpO2 98%   BMI 28.75 kg/m   Physical Exam Vitals and nursing note reviewed.  Constitutional:      Appearance: Normal appearance.  HENT:     Ears:     Comments: EAC clear bilaterally with good view of TM which is without effusion or erythema.     Nose: Nose normal.     Mouth/Throat:     Mouth: Mucous membranes are moist. No oral lesions.     Dentition: Normal dentition.     Pharynx: No posterior oropharyngeal erythema.  Eyes:     Extraocular Movements: Extraocular movements intact.     Conjunctiva/sclera: Conjunctivae normal.     Pupils: Pupils are equal, round, and reactive to light.  Neck:     Thyroid: No thyromegaly.  Cardiovascular:     Rate and Rhythm: Normal rate and regular rhythm.     Heart sounds: No murmur heard.    No friction rub. No gallop.     Comments: Pulses 2+ at radial, PT, DP bilaterally. No carotid bruit. No peripheral edema Pulmonary:     Effort: Pulmonary effort is normal.     Breath sounds: Normal breath sounds.  Abdominal:     General: Bowel sounds are normal.     Palpations: Abdomen is soft. There is no mass.     Tenderness: There is no abdominal tenderness.  Genitourinary:    Comments: Genital/rectal exam deferred. Reviewed technique for testicular self-exam. Musculoskeletal:     Comments: Full ROM with strength 5/5 bilateral upper and lower extremities  Lymphadenopathy:     Cervical: No cervical adenopathy.  Skin:    General: Skin is warm.     Capillary Refill: Capillary refill takes less than 2 seconds.     Findings: No lesion or  rash.  Neurological:     Mental Status: He is alert and oriented to person, place, and time.     Gait: Gait is intact.  Psychiatric:        Mood and Affect: Mood normal.        Behavior: Behavior normal.     Recent Labs     Component Value Date/Time   NA 144 08/11/2022 0947   K 4.4 08/11/2022 0947   CL 108 (H) 08/11/2022 0947   CO2 22 08/11/2022 0947   GLUCOSE 95 08/11/2022 0947   GLUCOSE 123 (H) 06/30/2015  1623   BUN 12 08/11/2022 0947   CREATININE 1.22 08/11/2022 0947   CALCIUM 9.6 08/11/2022 0947   PROT 7.5 06/30/2015 1623   ALBUMIN 4.5 06/30/2015 1623   AST 30 06/30/2015 1623   ALT 19 06/30/2015 1623   ALKPHOS 108 06/30/2015 1623   BILITOT 0.9 06/30/2015 1623   GFRNONAA >60 06/30/2015 1623   GFRAA >60 06/30/2015 1623    Lab Results  Component Value Date   WBC 8.6 06/30/2015   HGB 16.8 06/30/2015   HCT 50.3 06/30/2015   MCV 83.5 06/30/2015   PLT 279 06/30/2015   No results found for: "HGBA1C" No results found for: "CHOL", "HDL", "LDLCALC", "LDLDIRECT", "TRIG", "CHOLHDL" No results found for: "TSH"   Assessment and Plan:  1. Annual physical exam Seemingly healthy patient with minor abnormalities on exam. Encouraged healthy lifestyle including regular physical activity and consumption of whole fruits and vegetables. Encouraged routine dental and eye exams. Vaccinations up to date.    2. Benign localized prostatic hyperplasia with lower urinary tract symptoms (LUTS) Resending prescription for tamsulosin. - tamsulosin (FLOMAX) 0.4 MG CAPS capsule; Take 1 capsule (0.4 mg total) by mouth daily.  Dispense: 30 capsule; Refill: 2  3. Gastroesophageal reflux disease without esophagitis Patient reassured this is almost definitely the source of his chest pain given that it is not exertional and not made worse by breathing.  Because it happens so infrequently, he can treat as needed with OTC products such as omeprazole or famotidine.  Advised to avoid food triggers  4.  Tobacco use disorder Counseled on cessation   Return in about 6 months (around 09/30/2023) for OV f/u chronic conditions.    Alvester Morin, PA-C, DMSc, Nutritionist [facility] [phone]

## 2023-08-22 ENCOUNTER — Emergency Department (HOSPITAL_COMMUNITY)

## 2023-08-22 ENCOUNTER — Emergency Department (HOSPITAL_COMMUNITY)
Admission: EM | Admit: 2023-08-22 | Discharge: 2023-08-22 | Disposition: A | Discharge status: Home / Self Care | Payer: Worker's Compensation

## 2023-08-22 ENCOUNTER — Emergency Department (HOSPITAL_COMMUNITY): Payer: Worker's Compensation

## 2023-08-22 ENCOUNTER — Encounter (HOSPITAL_COMMUNITY): Payer: Self-pay

## 2023-08-22 ENCOUNTER — Other Ambulatory Visit: Payer: Self-pay

## 2023-08-22 DIAGNOSIS — S29012A Strain of muscle and tendon of back wall of thorax, initial encounter: Secondary | ICD-10-CM | POA: Diagnosis not present

## 2023-08-22 DIAGNOSIS — Y990 Civilian activity done for income or pay: Secondary | ICD-10-CM | POA: Diagnosis not present

## 2023-08-22 DIAGNOSIS — S39012A Strain of muscle, fascia and tendon of lower back, initial encounter: Secondary | ICD-10-CM | POA: Diagnosis not present

## 2023-08-22 DIAGNOSIS — Z91010 Allergy to peanuts: Secondary | ICD-10-CM | POA: Diagnosis not present

## 2023-08-22 DIAGNOSIS — S3992XA Unspecified injury of lower back, initial encounter: Secondary | ICD-10-CM | POA: Diagnosis present

## 2023-08-22 DIAGNOSIS — S29019A Strain of muscle and tendon of unspecified wall of thorax, initial encounter: Secondary | ICD-10-CM

## 2023-08-22 MED ORDER — OXYCODONE-ACETAMINOPHEN 5-325 MG PO TABS
1.0000 | ORAL_TABLET | Freq: Once | ORAL | Status: AC
  Administered 2023-08-22: 1 via ORAL
  Filled 2023-08-22: qty 1

## 2023-08-22 MED ORDER — NAPROXEN 500 MG PO TABS: 500.0000 mg | ORAL_TABLET | Freq: Two times a day (BID) | ORAL | 0 refills | Status: DC

## 2023-08-22 MED ORDER — METHOCARBAMOL 500 MG PO TABS: 500.0000 mg | ORAL_TABLET | Freq: Two times a day (BID) | ORAL | 0 refills | Status: DC

## 2023-08-22 MED ORDER — IBUPROFEN 800 MG PO TABS
800.0000 mg | ORAL_TABLET | Freq: Once | ORAL | Status: AC
  Administered 2023-08-22: 800 mg via ORAL
  Filled 2023-08-22: qty 1

## 2023-08-22 NOTE — Discharge Instructions (Addendum)
 Your x-rays and CT scans did not show any acute findings here today.  Please take the naproxen twice daily.  Take the methocarbamol as needed for muscle spasms.  Do not drive drink alcohol taking this as may make you drowsy.  Please follow-up with your doctor and return to the ER for worsening symptoms.

## 2023-08-22 NOTE — ED Triage Notes (Signed)
 Pt arrived via POV reporting worker comp injury while at work. 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 Pt also reports left leg and hip pain. Pt denies hitting his head and denies LOC.

## 2023-08-22 NOTE — ED Provider Notes (Signed)
 [place] EMERGENCY DEPARTMENT AT [facility] Provider Note   CSN: [id number] Arrival date & time: 08/22/23  1450     History  Chief Complaint  Patient presents with   Back Pain    Austin Herrera is a 43 y.o. male.  43 year old male with no reported past medical history presenting to the emergency department today with pain in his back.  The patient states that he was on a forklift at work when it went through the floor.  He did not hit his head or lose consciousness.  He denies any chest pain with this.  He states he is having pain mostly in his low back and around the left side of his pelvis since this occurred.  He denies any bowel or bladder dysfunction and has not had any saddle anesthesia.  He denies any focal weakness, numbness, or tingling.   Back Pain      Home Medications Prior to Admission medications   Medication Sig Start Date End Date Taking? Authorizing Provider  methocarbamol (ROBAXIN) 500 MG tablet Take 1 tablet (500 mg total) by mouth 2 (two) times daily. 08/22/23  Yes Durwin Glaze, MD  naproxen (NAPROSYN) 500 MG tablet Take 1 tablet (500 mg total) by mouth 2 (two) times daily. 08/22/23  Yes Durwin Glaze, MD  tamsulosin (FLOMAX) 0.4 MG CAPS capsule Take 1 capsule (0.4 mg total) by mouth daily. 04/02/23   Remo Lipps, PA      Allergies    Other and Peanut-containing drug products    Review of Systems   Review of Systems  Musculoskeletal:  Positive for back pain.    Physical Exam Updated Vital Signs BP (!) 142/94 (BP Location: Left Arm)   Pulse 87   Temp 98.5 F (36.9 C) (Oral)   Resp 16   Ht 6' (1.829 m)   Wt 96 kg   SpO2 100%   BMI 28.70 kg/m  Physical Exam Vitals and nursing note reviewed.   Gen: NAD Eyes: PERRL, EOMI HEENT: no oropharyngeal swelling Neck: trachea midline, no midline tenderness Resp: clear to auscultation bilaterally Card: RRR, no murmurs, rubs, or gallops Abd: nontender, nondistended Extremities: no calf  tenderness, no edema MSK: The patient is tender over the mid thoracic region as well as the upper lumbar region with no step-offs or deformities, negative straight leg raise bilaterally Vascular: 2+ radial pulses bilaterally, 2+ DP pulses bilaterally Neuro: Equal strength and sensation throughout the bilateral lower extremities with normal patellar and Achilles reflexes bilaterally Skin: no rashes Psyc: acting appropriately   ED Results / Procedures / Treatments   Labs (all labs ordered are listed, but only abnormal results are displayed) Labs Reviewed - No data to display  EKG None  Radiology CT Thoracic Spine Wo Contrast Result Date: 08/22/2023 CLINICAL DATA:  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 Ataxia. EXAM: CT THORACIC AND LUMBAR SPINE WITHOUT CONTRAST TECHNIQUE: Multidetector CT imaging of the thoracic and lumbar spine was performed without contrast. Multiplanar CT image reconstructions were also generated. RADIATION DOSE REDUCTION: This exam was performed according to the departmental dose-optimization program which includes automated exposure control, adjustment of the mA and/or kV according to patient size and/or use of iterative reconstruction technique. COMPARISON:  Same day radiographs FINDINGS: CT THORACIC SPINE FINDINGS Alignment: No evidence of traumatic listhesis. Vertebrae: No acute fracture. Paraspinal and other soft tissues: Negative. Disc levels: Intervertebral disc space height is maintained. No severe spinal canal or neural foraminal narrowing.  CT LUMBAR SPINE FINDINGS Segmentation: 5 lumbar type vertebrae. Alignment: No evidence of traumatic listhesis. Vertebrae: No acute fracture. Paraspinal and other soft tissues: No acute abnormality. Disc levels: Intervertebral disc space height is maintained. No severe spinal canal or neural foraminal narrowing. IMPRESSION: 1. No acute fracture in the thoracolumbar spine. Electronically Signed   By: Minerva Fester  M.D.   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 Ataxia. EXAM: CT THORACIC AND LUMBAR SPINE WITHOUT CONTRAST TECHNIQUE: Multidetector CT imaging of the thoracic and lumbar spine was performed without contrast. Multiplanar CT image reconstructions were also generated. RADIATION DOSE REDUCTION: This exam was performed according to the departmental dose-optimization program which includes automated exposure control, adjustment of the mA and/or kV according to patient size and/or use of iterative reconstruction technique. COMPARISON:  Same day radiographs FINDINGS: CT THORACIC SPINE FINDINGS Alignment: No evidence of traumatic listhesis. Vertebrae: No acute fracture. Paraspinal and other soft tissues: Negative. Disc levels: Intervertebral disc space height is maintained. No severe spinal canal or neural foraminal narrowing. CT LUMBAR SPINE FINDINGS Segmentation: 5 lumbar type vertebrae. Alignment: No evidence of traumatic listhesis. Vertebrae: No acute fracture. Paraspinal and other soft tissues: No acute abnormality. Disc levels: Intervertebral disc space height is maintained. No severe spinal canal or neural foraminal narrowing. IMPRESSION: 1. No acute fracture in the thoracolumbar spine. Electronically Signed   By: Minerva Fester M.D.   On: 08/22/2023 20:48   CT PELVIS WO CONTRAST Result Date: 08/22/2023 CLINICAL DATA:  Hip trauma, fracture suspected. Left leg and hip pain. EXAM: CT PELVIS WITHOUT CONTRAST TECHNIQUE: Multidetector CT imaging of the pelvis was performed following the standard protocol without intravenous contrast. RADIATION DOSE REDUCTION: This exam was performed according to the departmental dose-optimization program which includes automated exposure control, adjustment of the mA and/or kV according to patient size and/or use of iterative reconstruction technique. COMPARISON:  None  Available. FINDINGS: Urinary Tract:  No abnormality visualized. Bowel:  Colonic diverticulosis without diverticulitis. Vascular/Lymphatic: No pathologically enlarged lymph nodes. No significant vascular abnormality seen. Reproductive:  No mass or other significant abnormality Other:  None. Musculoskeletal: No acute fracture. IMPRESSION: 1. No acute fracture. Electronically Signed   By: Minerva Fester M.D.   On: 08/22/2023 20:43   DG HIP UNILAT WITH PELVIS 2-3 VIEWS LEFT Result Date: 08/22/2023 CLINICAL DATA:  Fall.  Left hip pain. EXAM: DG HIP (WITH OR WITHOUT PELVIS) 2-3V LEFT COMPARISON:  None Available. FINDINGS: Pelvis is intact with normal and symmetric sacroiliac joints. No acute fracture or dislocation. No aggressive osseous lesion. Visualized sacral arcuate lines are unremarkable. Unremarkable symphysis pubis. Unremarkable bilateral hip joints. No radiopaque foreign bodies. IMPRESSION: *No acute osseous abnormality of the pelvis or left hip joint. Electronically Signed   By: Jules Schick M.D.   On: 08/22/2023 17:18   DG Forearm Left Result Date: 08/22/2023 CLINICAL DATA:  Fall.  Injury. EXAM: LEFT FOREARM - 2 VIEW COMPARISON:  None Available. FINDINGS: No acute fracture or dislocation. No aggressive osseous lesion. No significant degenerative changes of imaged joints. No radiopaque foreign bodies. Soft tissues are within normal limits. IMPRESSION: No acute osseous abnormality of the left forearm. Electronically Signed   By: Jules Schick M.D.   On: 08/22/2023 17:12   DG Thoracic Spine 2 View Result Date: 08/22/2023 CLINICAL DATA:  Fall.  Injury.  Back pain. EXAM: THORACIC SPINE 2 VIEWS COMPARISON:  None Available. FINDINGS:  Unremarkable spinal curvature. No spondylolisthesis. Vertebral body heights are maintained. No aggressive osseous lesion. Intervertebral disc heights are maintained. No significant degenerative changes. Visualized soft tissues are within normal limits. IMPRESSION: *No acute  osseous abnormality of the thoracic spine. Electronically Signed   By: Jules Schick M.D.   On: 08/22/2023 17:11   DG Cervical Spine Complete Result Date: 08/22/2023 CLINICAL DATA:  fall injury. EXAM: CERVICAL SPINE - COMPLETE 4+ VIEW COMPARISON:  None Available. FINDINGS: There is loss of cervical lordosis, which may be on the basis of positioning or due to muscle spasm, No spondylolisthesis. Vertebral body heights are maintained. No fracture or destructive lesion. The C1 lateral masses are symmetrically positioned around the odontoid process. Intervertebral disc heights are maintained. Minimal marginal osteophyte formation noted at C4 through C6 levels. The neural foramina are widely patent. Prevertebral soft tissues within normal limits. IMPRESSION: No acute osseous abnormality of the cervical spine. Electronically Signed   By: Jules Schick M.D.   On: 08/22/2023 17:10    Procedures Procedures    Medications Ordered in ED Medications  ibuprofen (ADVIL) tablet 800 mg (800 mg Oral Given 08/22/23 1719)  oxyCODONE-acetaminophen (PERCOCET/ROXICET) 5-325 MG per tablet 1 tablet (1 tablet Oral Given 08/22/23 1959)    ED Course/ Medical Decision Making/ A&P                                 Medical Decision Making 43 year old male presenting the emergency department today with pain in his lower back as well as over his left ASIS after he essentially had a fall from height earlier today.  The patient is otherwise well-appearing with stable vital signs.  His x-rays ordered at triage are unremarkable.  I will further evaluate him here with a CT scan of his thoracic and lumbar spine as well as a CT scan of his pelvis to evaluate for acute fractures or injuries.  I will give the patient Percocet here for pain.  Will reevaluate for ultimate disposition.  He does not have any red flag symptoms for cauda equina syndrome or cord compressing lesion at this time.  The patient's work appears reassuring.  CT scans  are unremarkable.  He will be discharged with NSAIDs and muscle relaxers with return precautions.  Amount and/or Complexity of Data Reviewed Radiology: ordered.  Risk Prescription drug management.           Final Clinical Impression(s) / ED Diagnoses Final diagnoses:  Strain of lumbar region, initial encounter  Thoracic myofascial strain, initial encounter    Rx / DC Orders ED Discharge Orders          Ordered    naproxen (NAPROSYN) 500 MG tablet  2 times daily        08/22/23 2205    methocarbamol (ROBAXIN) 500 MG tablet  2 times daily        08/22/23 2205              Durwin Glaze, MD 08/22/23 2208

## 2023-08-22 NOTE — ED Notes (Signed)
 Called for Pt from waiting area. No answer X 2.

## 2023-08-22 NOTE — ED Notes (Signed)
 Pt notified in triage of situation in the back and that we're working on getting rooms open. We're simply waiting for rooms to come open.

## 2023-08-22 NOTE — ED Notes (Signed)
Patient transported to CT via wheelchair in NAD.

## 2023-08-31 ENCOUNTER — Ambulatory Visit (INDEPENDENT_AMBULATORY_CARE_PROVIDER_SITE_OTHER): Admitting: Physician Assistant

## 2023-08-31 ENCOUNTER — Encounter: Payer: Self-pay | Admitting: Physician Assistant

## 2023-08-31 VITALS — BP 110/86 | HR 97 | Temp 97.9°F | Ht 72.0 in | Wt 233.0 lb

## 2023-08-31 DIAGNOSIS — G4700 Insomnia, unspecified: Secondary | ICD-10-CM | POA: Insufficient documentation

## 2023-08-31 DIAGNOSIS — M5441 Lumbago with sciatica, right side: Secondary | ICD-10-CM | POA: Diagnosis not present

## 2023-08-31 MED ORDER — PREDNISONE 20 MG PO TABS: 20.0000 mg | ORAL_TABLET | Freq: Every day | ORAL | 0 refills | Status: AC

## 2023-08-31 MED ORDER — ZOLPIDEM TARTRATE 5 MG PO TABS: 5.0000 mg | ORAL_TABLET | Freq: Every day | ORAL | 1 refills | Status: DC

## 2023-08-31 MED ORDER — GABAPENTIN 300 MG PO CAPS: 300.0000 mg | ORAL_CAPSULE | Freq: Three times a day (TID) | ORAL | 0 refills | Status: DC | PRN

## 2023-08-31 NOTE — Progress Notes (Signed)
 Date:  08/31/2023   Name:  Austin Herrera   DOB:  09/13/1980   MRN:  [id number]   Chief Complaint: Back Pain and Leg Pain  Back Pain This is a new problem. Episode onset: X9 days. The problem occurs constantly. The problem has been gradually worsening since onset. The pain is present in the lumbar spine. The quality of the pain is described as burning and shooting. Radiates to: mid back. The pain is at a severity of 8/10. The pain is moderate. The pain is The same all the time. The symptoms are aggravated by bending, lying down and twisting. Stiffness is present All day. Associated symptoms include leg pain and weakness. He has tried muscle relaxant for the symptoms. The treatment provided no relief.  Leg Pain  Incident onset: X9 days. The incident occurred at work. The injury mechanism was a fall. The pain is present in the left leg. The quality of the pain is described as aching. The pain is at a severity of 6/10. Pain course: comes and goes. Associated symptoms include a loss of motion. He reports no foreign bodies present. The symptoms are aggravated by movement and weight bearing. He has tried ice and heat (muscle relaxers) for the symptoms. The treatment provided mild relief.   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  He was evaluated at [facility] same-day with x-rays and CT negative for fracture.  [facility] has since [facility], thinks he might be taking naproxen 500 mg and methocarbamol but says the medications have been switched a couple times due to side effects. He has tried cold compress on the back which helped temporarily. He is not doing any physical therapy. Currently involved in a worker's comp case.   Also complains of persistent insomnia even prior to injury, now obviously worse with the pain. Has never taken hypnotic agent.     Medication list has been reviewed and updated.  Current Meds  Medication Sig   gabapentin (NEURONTIN) 300 MG capsule Take 1 capsule (300 mg total) by mouth 3 (three) times daily as needed. May cause drowsiness   methocarbamol (ROBAXIN) 500 MG tablet Take 1 tablet (500 mg total) by mouth 2 (two) times daily.   predniSONE (DELTASONE) 20 MG tablet Take 1 tablet (20 mg total) by mouth daily with breakfast for 5 days.   tamsulosin (FLOMAX) 0.4 MG CAPS capsule Take 1 capsule (0.4 mg total) by mouth daily.   zolpidem (AMBIEN) 5 MG tablet Take 1 tablet (5 mg total) by mouth at bedtime.   [DISCONTINUED] cyclobenzaprine (FLEXERIL) 5 MG tablet TAKE ONE TAB BY MOUTH 2 TO 3 TIMES AS NEEDED   [DISCONTINUED] diclofenac (VOLTAREN) 75 MG EC tablet Take one tab PO BID with food   [DISCONTINUED] meloxicam (MOBIC) 15 MG tablet Take 15 mg by mouth daily.   [DISCONTINUED] naproxen (NAPROSYN) 500 MG tablet Take 1 tablet (500 mg total) by mouth 2 (two) times daily.   [DISCONTINUED] orphenadrine (NORFLEX) 100 MG tablet Take 100 mg by mouth 2 (two) times daily as needed.     Review of Systems  Musculoskeletal:  Positive for back pain.  Neurological:  Positive for weakness.    Patient Active Problem List   Diagnosis Date Noted   Tobacco use disorder 04/02/2023   Gastroesophageal reflux disease without esophagitis 04/02/2023   Benign localized prostatic hyperplasia  with lower urinary tract symptoms (LUTS) 08/11/2022   Positive screening for depression on 9-item Patient Health Questionnaire (PHQ-9) 08/11/2022    Allergies  Allergen Reactions   Other Itching   Peanut-Containing Drug Products     Immunization History  Administered Date(s) Administered   Pneumococcal Polysaccharide-23 07/02/2015   Tdap 07/02/2015    History reviewed. No pertinent surgical history.  Social History   Tobacco Use   Smoking status: Every Day    Current packs/day: 0.50    Average packs/day: 0.5 packs/day for 20.0  years (10.0 ttl pk-yrs)    Types: Cigarettes   Smokeless tobacco: Never  Vaping Use   Vaping status: Never Used  Substance Use Topics   Alcohol use: Yes    Comment: socially   Drug use: Yes    Types: Marijuana    Family History  Problem Relation Age of Onset   Prostate cancer Maternal Grandfather    Cancer Paternal Grandmother         08/31/2023    4:22 PM 04/02/2023    9:26 AM 08/11/2022    8:55 AM  GAD 7 : Generalized Anxiety Score  Nervous, Anxious, on Edge 3 0 2  Control/stop worrying 3 0 2  Worry too much - different things 3 0 2  Trouble relaxing 3 0 3  Restless 3 0 3  Easily annoyed or irritable 3 3 3   Afraid - awful might happen 3 0 1  Total GAD 7 Score 21 3 16   Anxiety Difficulty Extremely difficult Not difficult at all Very difficult       08/31/2023    4:21 PM 04/02/2023    9:26 AM 08/11/2022    8:55 AM  Depression screen PHQ 2/9  Decreased Interest 3 0 2  Down, Depressed, Hopeless 1 0 3  PHQ - 2 Score 4 0 5  Altered sleeping 3 3 2   Tired, decreased energy 3 0 2  Change in appetite 0 0 0  Feeling bad or failure about yourself  1 0 2  Trouble concentrating 0 0 1  Moving slowly or fidgety/restless 3 0 0  Suicidal thoughts 0 0 0  PHQ-9 Score 14 3 12   Difficult doing work/chores Extremely dIfficult Somewhat difficult Very difficult    BP Readings from Last 3 Encounters:  08/31/23 110/86  08/22/23 (!) 142/94  04/02/23 122/84    Wt Readings from Last 3 Encounters:  08/31/23 233 lb (105.7 kg)  08/22/23 211 lb 10.3 oz (96 kg)  04/02/23 212 lb (96.2 kg)    BP 110/86   Pulse 97   Temp 97.9 F (36.6 C)   Ht 6' (1.829 m)   Wt 233 lb (105.7 kg)   SpO2 97%   BMI 31.60 kg/m   Physical Exam Vitals and nursing note reviewed.  Constitutional:      Appearance: Normal appearance.  Cardiovascular:     Rate and Rhythm: Normal rate.  Pulmonary:     Effort: Pulmonary effort is normal.  Abdominal:     General: There is no distension.   Musculoskeletal:        General: Normal range of motion.     Comments: Midline spinal/paraspinal tenderness in the mid-thoracic and lower lumbar regions primarily. Also has tenderness of the upper right glute.   Skin:    General: Skin is warm and dry.  Neurological:     Mental Status: He is alert and oriented to person, place, and time.     Gait: Gait is intact.  Psychiatric:        Mood and Affect: Mood and affect normal.     Recent Labs     Component Value Date/Time   NA 144 08/11/2022 0947   K 4.4 08/11/2022 0947   CL 108 (H) 08/11/2022 0947   CO2 22 08/11/2022 0947   GLUCOSE 95 08/11/2022 0947   GLUCOSE 123 (H) 06/30/2015 1623   BUN 12 08/11/2022 0947   CREATININE 1.22 08/11/2022 0947   CALCIUM 9.6 08/11/2022 0947   PROT 7.5 06/30/2015 1623   ALBUMIN 4.5 06/30/2015 1623   AST 30 06/30/2015 1623   ALT 19 06/30/2015 1623   ALKPHOS 108 06/30/2015 1623   BILITOT 0.9 06/30/2015 1623   GFRNONAA >60 06/30/2015 1623   GFRAA >60 06/30/2015 1623    Lab Results  Component Value Date   WBC 8.6 06/30/2015   HGB 16.8 06/30/2015   HCT 50.3 06/30/2015   MCV 83.5 06/30/2015   PLT 279 06/30/2015   No results found for: "HGBA1C" No results found for: "CHOL", "HDL", "LDLCALC", "LDLDIRECT", "TRIG", "CHOLHDL" No results found for: "TSH"   Assessment and Plan:  1. Acute midline low back pain with right-sided sciatica (Primary) Stop all NSAIDs in favor of prednisone instead. Add gabapentin for what sounds like it may be some nerve entrapment/irritation with radiating pain into the right buttock. Cautioned gabapentin may make him drowsy, which could be a desired side effect if using at night.   Also encouraged to try a topical analgesic such as diclofenac gel or lidocaine patches.   - predniSONE (DELTASONE) 20 MG tablet; Take 1 tablet (20 mg total) by mouth daily with breakfast for 5 days.  Dispense: 5 tablet; Refill: 0 - gabapentin (NEURONTIN) 300 MG capsule; Take 1 capsule (300  mg total) by mouth 3 (three) times daily as needed. May cause drowsiness  Dispense: 30 capsule; Refill: 0  2. Insomnia, unspecified type If gabapentin does not help sleep, add zolpidem once nightly. Advised that this can be habit-forming, so we will be using short-term.   - zolpidem (AMBIEN) 5 MG tablet; Take 1 tablet (5 mg total) by mouth at bedtime.  Dispense: 30 tablet; Refill: 1    Return in about 2 weeks (around 09/14/2023) for OV f/u back pain, sleep.    Alvester Morin, PA-C, DMSc, Nutritionist [facility] Primary Care and Sports Medicine [facility] [phone]

## 2023-09-03 ENCOUNTER — Ambulatory Visit: Admitting: Family Medicine

## 2023-09-12 ENCOUNTER — Encounter: Payer: Self-pay | Admitting: Physician Assistant

## 2023-09-12 ENCOUNTER — Ambulatory Visit (INDEPENDENT_AMBULATORY_CARE_PROVIDER_SITE_OTHER): Payer: Self-pay | Admitting: Physician Assistant

## 2023-09-12 VITALS — BP 128/82 | HR 99 | Ht 72.0 in | Wt 222.0 lb

## 2023-09-12 DIAGNOSIS — K5730 Diverticulosis of large intestine without perforation or abscess without bleeding: Secondary | ICD-10-CM

## 2023-09-12 DIAGNOSIS — K219 Gastro-esophageal reflux disease without esophagitis: Secondary | ICD-10-CM

## 2023-09-12 DIAGNOSIS — R1032 Left lower quadrant pain: Secondary | ICD-10-CM

## 2023-09-12 DIAGNOSIS — R0681 Apnea, not elsewhere classified: Secondary | ICD-10-CM

## 2023-09-12 NOTE — Progress Notes (Signed)
 u   Date:  09/12/2023   Name:  Austin Herrera   DOB:  Nov 23, 1980   MRN:  [id number]   Chief Complaint: Back Pain and Sleeping Problem (Snoring a lot, stops breathing when sleeping, interested in sleep study )  HPI Raequon presents for 2-week follow-up on back pain and insomnia, both of which have significantly improved since her last visit.  He states that the zolpidem  gives him vivid dreams, so he started taking half tablets as needed (not every night) and this has improved.  He reports intermittent left lower quadrant abdominal pain, typically for several days at a time.  Recent CT of the pelvis 08/22/23 mentions diverticulosis without diverticulitis.  He also complains of acid reflux, which he usually treats with a mixture of baking soda and water.  He does not usually pay attention to the ratios.  He states this improves his gastric symptoms, but produces a lot of gas.  He mentions snoring.  Girlfriend says that he has episodes of apnea for 5-10 seconds at a time.  His mother has confirmed sleep apnea.   Medication list has been reviewed and updated.  Current Meds  Medication Sig   gabapentin  (NEURONTIN ) 300 MG capsule Take 1 capsule (300 mg total) by mouth 3 (three) times daily as needed. May cause drowsiness   methocarbamol  (ROBAXIN ) 500 MG tablet Take 1 tablet (500 mg total) by mouth 2 (two) times daily.   tamsulosin  (FLOMAX ) 0.4 MG CAPS capsule Take 1 capsule (0.4 mg total) by mouth daily.   zolpidem  (AMBIEN ) 5 MG tablet Take 1 tablet (5 mg total) by mouth at bedtime.     Review of Systems  Patient Active Problem List   Diagnosis Date Noted   Diverticulosis of colon 09/12/2023   Witnessed episode of apnea 09/12/2023   Insomnia 08/31/2023   Tobacco use disorder 04/02/2023   Gastroesophageal reflux disease without esophagitis 04/02/2023   Benign localized prostatic hyperplasia with lower urinary tract symptoms (LUTS) 08/11/2022   Positive screening for depression on 9-item  Patient Health Questionnaire (PHQ-9) 08/11/2022    Allergies  Allergen Reactions   Other Itching   Peanut-Containing Drug Products     Immunization History  Administered Date(s) Administered   Pneumococcal Polysaccharide-23 07/02/2015   Tdap 07/02/2015    History reviewed. No pertinent surgical history.  Social History   Tobacco Use   Smoking status: Every Day    Current packs/day: 0.50    Average packs/day: 0.5 packs/day for 20.0 years (10.0 ttl pk-yrs)    Types: Cigarettes   Smokeless tobacco: Never  Vaping Use   Vaping status: Never Used  Substance Use Topics   Alcohol use: Yes    Comment: socially   Drug use: Yes    Types: Marijuana    Family History  Problem Relation Age of Onset   Prostate cancer Maternal Grandfather    Cancer Paternal Grandmother         09/12/2023    3:54 PM 08/31/2023    4:22 PM 04/02/2023    9:26 AM 08/11/2022    8:55 AM  GAD 7 : Generalized Anxiety Score  Nervous, Anxious, on Edge 0 3 0 2  Control/stop worrying 2 3 0 2  Worry too much - different things 2 3 0 2  Trouble relaxing 0 3 0 3  Restless 0 3 0 3  Easily annoyed or irritable 3 3 3 3   Afraid - awful might happen 3 3 0 1  Total GAD 7 Score 10  21 3 16   Anxiety Difficulty Somewhat difficult Extremely difficult Not difficult at all Very difficult       09/12/2023    3:54 PM 08/31/2023    4:21 PM 04/02/2023    9:26 AM  Depression screen PHQ 2/9  Decreased Interest 0 3 0  Down, Depressed, Hopeless 0 1 0  PHQ - 2 Score 0 4 0  Altered sleeping 0 3 3  Tired, decreased energy 0 3 0  Change in appetite 0 0 0  Feeling bad or failure about yourself  0 1 0  Trouble concentrating 0 0 0  Moving slowly or fidgety/restless 0 3 0  Suicidal thoughts 0 0 0  PHQ-9 Score 0 14 3  Difficult doing work/chores Not difficult at all Extremely dIfficult Somewhat difficult    BP Readings from Last 3 Encounters:  09/12/23 128/82  08/31/23 110/86  08/22/23 (!) 142/94    Wt Readings from  Last 3 Encounters:  09/12/23 222 lb (100.7 kg)  08/31/23 233 lb (105.7 kg)  08/22/23 211 lb 10.3 oz (96 kg)    BP 128/82   Pulse 99   Ht 6' (1.829 m)   Wt 222 lb (100.7 kg)   SpO2 97%   BMI 30.11 kg/m   Physical Exam Vitals and nursing note reviewed.  Constitutional:      Appearance: Normal appearance.  Cardiovascular:     Rate and Rhythm: Normal rate.  Pulmonary:     Effort: Pulmonary effort is normal.  Abdominal:     General: There is no distension.     Palpations: Abdomen is soft.     Tenderness: There is abdominal tenderness in the epigastric area and left lower quadrant.  Musculoskeletal:        General: Normal range of motion.  Skin:    General: Skin is warm and dry.  Neurological:     Mental Status: He is alert and oriented to person, place, and time.     Gait: Gait is intact.  Psychiatric:        Mood and Affect: Mood and affect normal.     Recent Labs     Component Value Date/Time   NA 144 08/11/2022 0947   K 4.4 08/11/2022 0947   CL 108 (H) 08/11/2022 0947   CO2 22 08/11/2022 0947   GLUCOSE 95 08/11/2022 0947   GLUCOSE 123 (H) 06/30/2015 1623   BUN 12 08/11/2022 0947   CREATININE 1.22 08/11/2022 0947   CALCIUM 9.6 08/11/2022 0947   PROT 7.5 06/30/2015 1623   ALBUMIN 4.5 06/30/2015 1623   AST 30 06/30/2015 1623   ALT 19 06/30/2015 1623   ALKPHOS 108 06/30/2015 1623   BILITOT 0.9 06/30/2015 1623   GFRNONAA >60 06/30/2015 1623   GFRAA >60 06/30/2015 1623    Lab Results  Component Value Date   WBC 8.6 06/30/2015   HGB 16.8 06/30/2015   HCT 50.3 06/30/2015   MCV 83.5 06/30/2015   PLT 279 06/30/2015   No results found for: "HGBA1C" No results found for: "CHOL", "HDL", "LDLCALC", "LDLDIRECT", "TRIG", "CHOLHDL" No results found for: "TSH"   Assessment and Plan:  1. LLQ abdominal pain (Primary) Most likely trapped gas, possibly from drinking baking soda but consider also diverticulosis/diverticulitis.  Patient education on diverticulosis  printed today and attached to today's visit.  Encouraged adequate hydration and fiber intake.  Consider other conservative measures aimed at abdominal pain relief including local heat and yoga for trapped gas.   If no improvement or  if worsening over the next 24 to 48 hours, patient to contact me via MyChart for prescription of antibiotics to cover for diverticulitis.  2. Diverticulosis of colon Plan as above  3. Gastroesophageal reflux disease without esophagitis Stop drinking baking soda, opting instead for OTC Pepcid , Prilosec, or similar to treat GERD symptoms.  4. Witnessed episode of apnea Probable OSA, but given his current self-pay status, will hold off on specialist referral.  Patient education printed.  Discussed conservative measures including routine physical activity, weight loss, extra pillows, and ensuring open nasal passage at night.   Follow-up as needed   Cody Das, PA-C, DMSc, Nutritionist [facility] Primary Care and Sports Medicine [facility] [phone]

## 2023-10-08 ENCOUNTER — Ambulatory Visit: Payer: Self-pay

## 2023-10-08 NOTE — Telephone Encounter (Signed)
 Patient has appt.  JM

## 2023-10-08 NOTE — Telephone Encounter (Signed)
 Chief Complaint: Discomfort when swallowing Symptoms: pt states he can feel the food go all the way down Frequency: 1-2 weeks Pertinent Negatives: Patient denies redness, swelling, difficulty breathing Disposition: [] ED /[] Urgent Care (no appt availability in office) / [x] Appointment(In office/virtual)/ []  [facility]/ [] Home Care/ [] Refused Recommended Disposition /[] [facility]/ []  Follow-up with PCP Additional Notes: Call from pt and his fiance. Pt states that for the past few days and possibly up to 2 weeks ago he has discomfort when eating. He feels like it might be acid reflux. He also states that he can feel the food go "all the way down". Pt has chosen to come into the office on the 29th d/t transportation issues.    Copied from CRM [id number]. Topic: Clinical - Red Word Triage >> Oct 08, 2023  1:37 PM Ivette P wrote: Red Word that prompted transfer to Nurse Triage: having issues with Esophagus , burnning when swallowing or eating. unsure of acid reflex or unsure whats going on. complaining for 2 days Reason for Disposition  Swallowing difficulty is a chronic symptom (recurrent or ongoing AND present > 4 weeks)  Answer Assessment - Initial Assessment Questions 1. SYMPTOM: "What's the main symptom you're concerned about?" (e.g., chapped lips, dry mouth, lump, sores)     Issue swallowing 2. ONSET: "When did the  discomfort  start?"     1 week 3. PAIN: "Is there any pain?" If Yes, ask: "How bad is it?" (Scale: 1-10; mild, moderate, severe)   - MILD (1-3):  doesn't interfere with eating or normal activities   - MODERATE (4-7): interferes with eating some solids and normal activities   - SEVERE (8-10):  excruciating pain, interferes with most normal activities   - SEVERE DYSPHAGIA: can't swallow liquids, drooling     moderate 4. CAUSE: "What do you think is causing the symptoms?"     Unsure - can feel food moving all way down into stomach 5. OTHER SYMPTOMS: "Do  you have any other symptoms?" (e.g., fever, sore throat, toothache, swelling)     no  Answer Assessment - Initial Assessment Questions 1. DESCRIPTION: "Tell me more about this problem." "Are you  having trouble swallowing liquids, solids, or both?" "Any trouble with swallowing saliva (spit)?"     Pain with swallowing 2. SEVERITY: "How bad is the swallowing difficulty?"  (e.g., Scale 1-10; or mild, moderate, severe)   - MILD (0-3): Occasional swallowing difficulty; has trouble swallowing certain types of foods or liquids.   - MODERATE (4-7): Frequent swallowing difficulty; only able to swallow small amounts of foods and fluids.   - SEVERE (8-10): Unable to swallow any foods, fluids, or saliva; sensation of "lump in throat" or "something stuck in throat", and frequent drooling or spitting may be present.     moderate 3. ONSET: "When did the swallowing problems begin?"      1-2 weeks 4. CAUSE: "What do you think is causing the problem?"  (e.g., dry mouth, food or pill stuck in throat, mouth pain, sore throat, progression of disease process such as dementia or Parkinson's disease).      Acid reflux 5. CHRONIC or RECURRENT: "Is this a new problem for you?"  If No, ask: "How long have you had this problem?" (e.g., days, weeks, months)      1-2 weeks 6. OTHER SYMPTOMS: "Do you have any other symptoms?" (e.g., chest pain, difficulty breathing, mouth sores, sore throat, swollen tongue, chest pain)     no  Protocols used:  Mouth Symptoms-A-AH, Swallowing Difficulty-A-AH

## 2023-10-18 ENCOUNTER — Ambulatory Visit: Admitting: Physician Assistant

## 2023-11-07 ENCOUNTER — Ambulatory Visit: Payer: Self-pay

## 2023-11-07 NOTE — Telephone Encounter (Signed)
 Copied from CRM [id number]. Topic: Clinical - Red Word Triage >> Nov 07, 2023  5:49 PM Chrystal Crape R wrote: Pt woke up at 2:30am itching with swollen hives all over his back. Believes he's having an allergic reaction to something. Only know allergy is to Peanuts and he has no knowledge of ingesting it.   Reason for Disposition  Localized hives    Hives resolved at this time  Answer Assessment - Initial Assessment Questions Patient would like to discuss allergy testing, appointment made on 6/23   1. APPEARANCE: What does the rash look like?      Raised welts 2. LOCATION: Where is the rash located?      Back 3. NUMBER: How many hives are there?      Many 4. SIZE: How big are the hives? (inches, cm, compare to coins) Do they all look the same or is there lots of variation in shape and size?      Unsure  5. ONSET: When did the hives begin? (Hours or days ago)      2:30 am this morning  6. ITCHING: Does it itch? If Yes, ask: How bad is the itch?    - MILD: doesn't interfere with normal activities   - MODERATE-SEVERE: interferes with work, school, sleep, or other activities      Moderate  7. RECURRENT PROBLEM: Have you had hives before? If Yes, ask: When was the last time? and What happened that time?      Yes, allergic to peanuts but has not had any 8. TRIGGERS: Were you exposed to any new food, plant, cosmetic product or animal just before the hives began?     No 9. OTHER SYMPTOMS: Do you have any other symptoms? (e.g., fever, tongue swelling, difficulty breathing, abdomen pain)     No  Protocols used: Hives-A-AH    FYI Only or Action Required?: FYI only for provider.  Patient was last seen in primary care on 09/12/2023 by Leopoldo Rancher, PA. Called Nurse Triage reporting Allergic Reaction. Symptoms began today. Interventions attempted: OTC medications: Benadryl . Symptoms are: completely resolved.  Triage Disposition: Home Care  Patient/caregiver  understands and will follow disposition?: Yes

## 2023-11-08 NOTE — Telephone Encounter (Signed)
 Please call pt to see if he can be scheduled sooner.  KP

## 2023-11-09 ENCOUNTER — Ambulatory Visit: Admitting: Physician Assistant

## 2023-11-12 ENCOUNTER — Ambulatory Visit: Admitting: Physician Assistant

## 2024-04-02 ENCOUNTER — Ambulatory Visit (INDEPENDENT_AMBULATORY_CARE_PROVIDER_SITE_OTHER): Admitting: Physician Assistant

## 2024-04-02 ENCOUNTER — Encounter: Payer: Self-pay | Admitting: Physician Assistant

## 2024-04-02 VITALS — BP 120/80 | HR 78 | Temp 98.5°F | Ht 72.0 in | Wt 231.0 lb

## 2024-04-02 DIAGNOSIS — K219 Gastro-esophageal reflux disease without esophagitis: Secondary | ICD-10-CM | POA: Diagnosis not present

## 2024-04-02 DIAGNOSIS — R079 Chest pain, unspecified: Secondary | ICD-10-CM | POA: Diagnosis not present

## 2024-04-02 DIAGNOSIS — Z23 Encounter for immunization: Secondary | ICD-10-CM

## 2024-04-02 DIAGNOSIS — F17200 Nicotine dependence, unspecified, uncomplicated: Secondary | ICD-10-CM

## 2024-04-02 DIAGNOSIS — R0681 Apnea, not elsewhere classified: Secondary | ICD-10-CM

## 2024-04-02 DIAGNOSIS — Z0000 Encounter for general adult medical examination without abnormal findings: Secondary | ICD-10-CM | POA: Diagnosis not present

## 2024-04-02 DIAGNOSIS — G4700 Insomnia, unspecified: Secondary | ICD-10-CM

## 2024-04-02 MED ORDER — OMEPRAZOLE 20 MG PO CPDR: 20.0000 mg | DELAYED_RELEASE_CAPSULE | Freq: Every day | ORAL | 1 refills | Status: AC

## 2024-04-02 MED ORDER — NICOTINE POLACRILEX 2 MG MT GUM: 2.0000 mg | CHEWING_GUM | OROMUCOSAL | 0 refills | Status: AC | PRN

## 2024-04-02 MED ORDER — ZOLPIDEM TARTRATE 5 MG PO TABS: 5.0000 mg | ORAL_TABLET | Freq: Every evening | ORAL | 1 refills | Status: AC | PRN
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
## 2024-04-02 NOTE — Patient Instructions (Addendum)

## 2024-04-03 ENCOUNTER — Ambulatory Visit: Payer: Self-pay | Admitting: Physician Assistant

## 2024-04-03 DIAGNOSIS — E782 Mixed hyperlipidemia: Secondary | ICD-10-CM | POA: Insufficient documentation

## 2024-04-03 LAB — CBC WITH DIFFERENTIAL/PLATELET
Basophils Absolute: 0 x10E3/uL (ref 0.0–0.2)
Basos: 1 %
EOS (ABSOLUTE): 0.1 x10E3/uL (ref 0.0–0.4)
Eos: 1 %
Hematocrit: 45.3 % (ref 37.5–51.0)
Hemoglobin: 15 g/dL (ref 13.0–17.7)
Immature Grans (Abs): 0 x10E3/uL (ref 0.0–0.1)
Immature Granulocytes: 0 %
Lymphocytes Absolute: 2.3 x10E3/uL (ref 0.7–3.1)
Lymphs: 35 %
MCH: 28.1 pg (ref 26.6–33.0)
MCHC: 33.1 g/dL (ref 31.5–35.7)
MCV: 85 fL (ref 79–97)
Monocytes Absolute: 0.5 x10E3/uL (ref 0.1–0.9)
Monocytes: 7 %
Neutrophils Absolute: 3.7 x10E3/uL (ref 1.4–7.0)
Neutrophils: 56 %
Platelets: 301 x10E3/uL (ref 150–450)
RBC: 5.34 x10E6/uL (ref 4.14–5.80)
RDW: 13.5 % (ref 11.6–15.4)
WBC: 6.5 x10E3/uL (ref 3.4–10.8)

## 2024-04-03 LAB — COMPREHENSIVE METABOLIC PANEL WITH GFR
ALT: 15 IU/L (ref 0–44)
AST: 18 IU/L (ref 0–40)
Albumin: 4.2 g/dL (ref 4.1–5.1)
Alkaline Phosphatase: 133 IU/L — ABNORMAL HIGH (ref 47–123)
BUN/Creatinine Ratio: 9 (ref 9–20)
BUN: 11 mg/dL (ref 6–24)
Bilirubin Total: 0.3 mg/dL (ref 0.0–1.2)
CO2: 22 mmol/L (ref 20–29)
Calcium: 9.4 mg/dL (ref 8.7–10.2)
Chloride: 105 mmol/L (ref 96–106)
Creatinine, Ser: 1.23 mg/dL (ref 0.76–1.27)
Globulin, Total: 2.7 g/dL (ref 1.5–4.5)
Glucose: 104 mg/dL — ABNORMAL HIGH (ref 70–99)
Potassium: 4.4 mmol/L (ref 3.5–5.2)
Sodium: 139 mmol/L (ref 134–144)
Total Protein: 6.9 g/dL (ref 6.0–8.5)
eGFR: 75 mL/min/1.73 (ref >59)

## 2024-04-03 LAB — LIPID PANEL
Chol/HDL Ratio: 8.8 ratio — ABNORMAL HIGH (ref 0.0–5.0)
Cholesterol, Total: 237 mg/dL — ABNORMAL HIGH (ref 100–199)
HDL: 27 mg/dL — ABNORMAL LOW (ref >39)
LDL Chol Calc (NIH): 178 mg/dL — ABNORMAL HIGH (ref 0–99)
Triglycerides: 170 mg/dL — ABNORMAL HIGH (ref 0–149)
VLDL Cholesterol Cal: 32 mg/dL (ref 5–40)

## 2024-04-03 LAB — PSA: Prostate Specific Ag, Serum: 0.9 ng/mL (ref 0.0–4.0)

## 2024-04-03 LAB — TSH: TSH: 1.67 u[IU]/mL (ref 0.450–4.500)

## 2024-04-08 NOTE — Telephone Encounter (Signed)
 Please review.  KP

## 2024-04-10 ENCOUNTER — Ambulatory Visit (INDEPENDENT_AMBULATORY_CARE_PROVIDER_SITE_OTHER): Admitting: Physician Assistant

## 2024-04-10 ENCOUNTER — Ambulatory Visit
Admission: RE | Admit: 2024-04-10 | Discharge: 2024-04-10 | Disposition: A | Discharge status: Home / Self Care | Attending: Physician Assistant | Admitting: Physician Assistant

## 2024-04-10 ENCOUNTER — Encounter: Payer: Self-pay | Admitting: Physician Assistant

## 2024-04-10 ENCOUNTER — Ambulatory Visit
Admission: RE | Admit: 2024-04-10 | Discharge: 2024-04-10 | Disposition: A | Discharge status: Home / Self Care | Source: Ambulatory Visit | Attending: Physician Assistant | Admitting: Physician Assistant

## 2024-04-10 VITALS — BP 110/82 | HR 79 | Temp 98.0°F | Ht 72.0 in | Wt 225.0 lb

## 2024-04-10 DIAGNOSIS — G8929 Other chronic pain: Secondary | ICD-10-CM | POA: Diagnosis not present

## 2024-04-10 DIAGNOSIS — F411 Generalized anxiety disorder: Secondary | ICD-10-CM | POA: Insufficient documentation

## 2024-04-10 DIAGNOSIS — M25552 Pain in left hip: Secondary | ICD-10-CM

## 2024-04-10 MED ORDER — MELOXICAM 15 MG PO TABS: 15.0000 mg | ORAL_TABLET | Freq: Every day | ORAL | 0 refills | Status: AC

## 2024-04-10 MED ORDER — DULOXETINE HCL 30 MG PO CPEP: 30.0000 mg | ORAL_CAPSULE | Freq: Every day | ORAL | 0 refills | Status: AC

## 2024-04-10 NOTE — Assessment & Plan Note (Signed)
 Updated x-ray obtained today, appears normal on my initial review, radiology interpretation pending.  Start meloxicam once daily with food for at least the next 2 to 4 weeks and assess progress at follow-up.  If no improvement, will consider physical therapy and referral to my colleague Dr. Selinda Ku in sports medicine

## 2024-04-10 NOTE — Assessment & Plan Note (Signed)
 Will try duloxetine  once daily which may help address multiple problems including anxiety, depression, and hip pain.  Patient is in agreement with plan.
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
## 2024-04-16 ENCOUNTER — Ambulatory Visit: Payer: Self-pay | Admitting: Physician Assistant

## 2024-04-22 ENCOUNTER — Ambulatory Visit: Admitting: Sleep Medicine

## 2024-04-30 ENCOUNTER — Ambulatory Visit: Admitting: Sleep Medicine

## 2024-04-30 ENCOUNTER — Encounter: Payer: Self-pay | Admitting: Sleep Medicine

## 2024-04-30 VITALS — BP 126/100 | HR 88 | Temp 97.6°F | Ht 72.0 in | Wt 222.2 lb

## 2024-04-30 DIAGNOSIS — F411 Generalized anxiety disorder: Secondary | ICD-10-CM | POA: Diagnosis not present

## 2024-04-30 DIAGNOSIS — R030 Elevated blood-pressure reading, without diagnosis of hypertension: Secondary | ICD-10-CM | POA: Diagnosis not present

## 2024-04-30 DIAGNOSIS — G4733 Obstructive sleep apnea (adult) (pediatric): Secondary | ICD-10-CM

## 2024-04-30 DIAGNOSIS — G4710 Hypersomnia, unspecified: Secondary | ICD-10-CM

## 2024-04-30 DIAGNOSIS — F17210 Nicotine dependence, cigarettes, uncomplicated: Secondary | ICD-10-CM | POA: Diagnosis not present

## 2024-04-30 NOTE — Patient Instructions (Signed)
 SABRA

## 2024-04-30 NOTE — Progress Notes (Addendum)
 Name:Austin Herrera MRN: [id number] DOB: [birth year]   CHIEF COMPLAINT:  EXCESSIVE DAYTIME SLEEPINESS   HISTORY OF PRESENT ILLNESS: Austin Herrera is a 43 y.o. w/ a h/o anxiety, GERD and obesity who present for c/o loud snoring, witnessed apnea and excessive daytime sleepiness which has been present for several years. Reports nocturnal awakenings due to unclear reasons, however does not have difficulty falling back to sleep. Reports significant weight changes. Admits to dry mouth and morning headaches. Denies RLS symptoms, dream enactment, cataplexy, hypnagogic or hypnapompic hallucinations. Reports a family history of sleep apnea. Denies drowsy driving. Drinks 1 cup of tea daily, denies alcohol or drug use. Smokes 2 packs of tobacco per week, smokes marijuana daily.    Bedtime 9-11 pm Sleep onset varies Rise time 10 am   EPWORTH SLEEP SCORE 16    04/30/2024   11:11 AM  Results of the Epworth flowsheet  Sitting and reading 3  Watching TV 2  Sitting, inactive in a public place (e.g. a theatre or a meeting) 2  As a passenger in a car for an hour without a break 1  Lying down to rest in the afternoon when circumstances permit 3  Sitting and talking to someone 1  Sitting quietly after a lunch without alcohol 3  In a car, while stopped for a few minutes in traffic 1  Total score 16    PAST MEDICAL HISTORY :   has no past medical history on file.  has no past surgical history on file. Prior to Admission medications   Medication Sig Start Date End Date Taking? Authorizing Provider  DULoxetine  (CYMBALTA ) 30 MG capsule Take 1 capsule (30 mg total) by mouth daily. 04/10/24  Yes Manya Toribio SQUIBB, PA  meloxicam  (MOBIC ) 15 MG tablet Take 1 tablet (15 mg total) by mouth daily. 04/10/24  Yes Manya Toribio SQUIBB, PA  nicotine  polacrilex (NICORETTE ) 2 MG gum Take 1 each (2 mg total) by mouth as needed for smoking cessation. 04/02/24  Yes Manya Toribio SQUIBB, PA  omeprazole  (PRILOSEC) 20 MG  capsule Take 1 capsule (20 mg total) by mouth daily. 04/02/24  Yes Manya Toribio SQUIBB, PA  zolpidem  (AMBIEN ) 5 MG tablet Take 1 tablet (5 mg total) by mouth at bedtime as needed for sleep. 04/02/24 05/02/24 Yes Manya Toribio SQUIBB, PA   Allergies  Allergen Reactions   Other Itching   Peanut-Containing Drug Products     FAMILY HISTORY:  family history includes Cancer in his paternal grandmother; Prostate cancer in his maternal grandfather. SOCIAL HISTORY:  reports that he has been smoking cigarettes. He has a 10 pack-year smoking history. He has never used smokeless tobacco. He reports current alcohol use. He reports current drug use. Drug: Marijuana.   Review of Systems:  Gen:  Denies  fever, sweats, chills weight loss  HEENT: Denies blurred vision, double vision, ear pain, eye pain, hearing loss, nose bleeds, sore throat Cardiac:  No dizziness, chest pain or heaviness, chest tightness,edema, No JVD Resp:   No cough, -sputum production, -shortness of breath,-wheezing, -hemoptysis,  Gi: Denies swallowing difficulty, stomach pain, nausea or vomiting, diarrhea, constipation, bowel incontinence Gu:  Denies bladder incontinence, burning urine Ext:   Denies Joint pain, stiffness or swelling Skin: Denies  skin rash, easy bruising or bleeding or hives Endoc:  Denies polyuria, polydipsia , polyphagia or weight change Psych:   Denies depression, insomnia or hallucinations  Other:  All other systems negative  VITAL SIGNS: BP (!) 120/100  Pulse 88   Temp 97.6 F (36.4 C) (Temporal)   Ht 6' (1.829 m)   Wt 222 lb 3.2 oz (100.8 kg)   SpO2 97%   BMI 30.14 kg/m    Physical Examination:   General Appearance: No distress  EYES PERRLA, EOM intact.   NECK Supple, No JVD Pulmonary: normal breath sounds, No wheezing.  CardiovascularNormal S1,S2.  No m/r/g.   Abdomen: Benign, Soft, non-tender. Skin:   warm, no rashes, no ecchymosis  Extremities: normal, no cyanosis, clubbing. Neuro:without  focal findings,  speech normal  PSYCHIATRIC: Mood, affect within normal limits.   ASSESSMENT AND PLAN  OSA I suspect that OSA is likely present due to clinical presentation. Discussed the consequences of untreated sleep apnea. Advised not to drive drowsy for safety of patient and others. Will complete further evaluation with a home sleep study and follow up to review results.    Anxiety Stable, on current management. Following with PCP.   Elevated BP Advised patient to follow up with PCP for further management.    MEDICATION ADJUSTMENTS/LABS AND TESTS ORDERED: Recommend Sleep Study   Patient  satisfied with Plan of action and management. All questions answered  Follow up to review HST results and treatment plan.   I spent a total of 63 minutes reviewing chart data, face-to-face evaluation with the patient, counseling and coordination of care as detailed above.    Austin Herrera, M.D.  Sleep Medicine [facility]

## 2024-05-02 ENCOUNTER — Ambulatory Visit: Admitting: Physician Assistant

## 2024-05-02 ENCOUNTER — Encounter: Payer: Self-pay | Admitting: Physician Assistant

## 2024-08-04 ENCOUNTER — Ambulatory Visit: Admitting: Physician Assistant

## 2025-04-06 ENCOUNTER — Encounter: Admitting: Physician Assistant
